# Patient Record
Sex: Male | Born: 1970 | Race: Black or African American | Hispanic: No | Marital: Married | Smoking: Never smoker
Health system: Southern US, Community
[De-identification: ages and names within clinical notes are randomized; demographics above are authoritative.]

## PROBLEM LIST (undated history)

## (undated) ENCOUNTER — Ambulatory Visit (HOSPITAL_COMMUNITY): Discharge status: Absent without leave | Payer: BC Managed Care – PPO

## (undated) DIAGNOSIS — I10 Essential (primary) hypertension: Secondary | ICD-10-CM

## (undated) DIAGNOSIS — E669 Obesity, unspecified: Secondary | ICD-10-CM

## (undated) DIAGNOSIS — E119 Type 2 diabetes mellitus without complications: Secondary | ICD-10-CM

## (undated) DIAGNOSIS — M109 Gout, unspecified: Secondary | ICD-10-CM

---

## 2013-07-23 DIAGNOSIS — I10 Essential (primary) hypertension: Secondary | ICD-10-CM | POA: Diagnosis present

## 2013-07-23 DIAGNOSIS — M109 Gout, unspecified: Secondary | ICD-10-CM | POA: Insufficient documentation

## 2019-03-29 ENCOUNTER — Ambulatory Visit: Payer: Self-pay | Attending: Internal Medicine

## 2019-03-29 DIAGNOSIS — Z23 Encounter for immunization: Secondary | ICD-10-CM

## 2019-03-29 NOTE — Progress Notes (Signed)
   Covid-19 Vaccination Clinic  Name:  [name] [name]    MRN: [id number] DOB: January 22, 1970  03/29/2019  Mr. [name] [name] was observed post Covid-19 immunization for 15 minutes without incident. He was provided with Vaccine Information Sheet and instruction to access the V-Safe system.   Mr. [name] [name] was instructed to call 911 with any severe reactions post vaccine: Marland Kitchen Difficulty breathing  . Swelling of face and throat  . A fast heartbeat  . A bad rash all over body  . Dizziness and weakness   Immunizations Administered    Name Date Dose VIS Date Route   Pfizer COVID-19 Vaccine 03/29/2019  8:49 AM 0.3 mL 12/15/2018 Intramuscular   Manufacturer: [employer], [employer]   Lot: [id number]   NDC: [address]-1

## 2019-04-24 ENCOUNTER — Ambulatory Visit: Payer: Self-pay | Attending: Internal Medicine

## 2019-04-24 DIAGNOSIS — Z23 Encounter for immunization: Secondary | ICD-10-CM

## 2019-04-24 NOTE — Progress Notes (Signed)
   Covid-19 Vaccination Clinic  Name:  Garhett Bernhard    MRN: [id number] DOB: 03-16-1970  04/24/2019  Mr. Wholey was observed post Covid-19 immunization for 15 minutes without incident. He was provided with Vaccine Information Sheet and instruction to access the V-Safe system.   Mr. Petzold was instructed to call 911 with any severe reactions post vaccine: Marland Kitchen Difficulty breathing  . Swelling of face and throat  . A fast heartbeat  . A bad rash all over body  . Dizziness and weakness   Immunizations Administered    Name Date Dose VIS Date Route   Pfizer COVID-19 Vaccine 04/24/2019  8:41 AM 0.3 mL 02/28/2018 Intramuscular   Manufacturer: [employer], [employer]   Lot: [id number]   NDC: [address]-6

## 2019-08-08 ENCOUNTER — Ambulatory Visit: Payer: Self-pay | Admitting: Dietician

## 2019-08-30 ENCOUNTER — Encounter: Payer: Self-pay | Admitting: Dietician

## 2019-08-30 NOTE — Progress Notes (Signed)
Have not heard back from patient to reschedule his missed appointment on 08/08/19. Sent notification to referring provider.

## 2020-08-22 ENCOUNTER — Emergency Department: Payer: BC Managed Care – PPO

## 2020-08-22 ENCOUNTER — Other Ambulatory Visit: Payer: Self-pay

## 2020-08-22 DIAGNOSIS — I10 Essential (primary) hypertension: Secondary | ICD-10-CM | POA: Diagnosis present

## 2020-08-22 DIAGNOSIS — Z20822 Contact with and (suspected) exposure to covid-19: Secondary | ICD-10-CM | POA: Diagnosis present

## 2020-08-22 DIAGNOSIS — R600 Localized edema: Secondary | ICD-10-CM | POA: Diagnosis present

## 2020-08-22 DIAGNOSIS — L97519 Non-pressure chronic ulcer of other part of right foot with unspecified severity: Secondary | ICD-10-CM | POA: Diagnosis present

## 2020-08-22 DIAGNOSIS — Z6841 Body Mass Index (BMI) 40.0 and over, adult: Secondary | ICD-10-CM

## 2020-08-22 DIAGNOSIS — M109 Gout, unspecified: Secondary | ICD-10-CM | POA: Diagnosis present

## 2020-08-22 DIAGNOSIS — L03115 Cellulitis of right lower limb: Principal | ICD-10-CM | POA: Diagnosis present

## 2020-08-22 DIAGNOSIS — E11621 Type 2 diabetes mellitus with foot ulcer: Secondary | ICD-10-CM | POA: Diagnosis present

## 2020-08-22 LAB — CBC WITH DIFFERENTIAL/PLATELET
Abs Immature Granulocytes: 0.16 10*3/uL — ABNORMAL HIGH (ref 0.00–0.07)
Basophils Absolute: 0.1 10*3/uL (ref 0.0–0.1)
Basophils Relative: 0 %
Eosinophils Absolute: 0.2 10*3/uL (ref 0.0–0.5)
Eosinophils Relative: 1 %
HCT: 37.4 % — ABNORMAL LOW (ref 39.0–52.0)
Hemoglobin: 12.1 g/dL — ABNORMAL LOW (ref 13.0–17.0)
Immature Granulocytes: 1 %
Lymphocytes Relative: 15 %
Lymphs Abs: 2.1 10*3/uL (ref 0.7–4.0)
MCH: 29.5 pg (ref 26.0–34.0)
MCHC: 32.4 g/dL (ref 30.0–36.0)
MCV: 91.2 fL (ref 80.0–100.0)
Monocytes Absolute: 1.1 10*3/uL — ABNORMAL HIGH (ref 0.1–1.0)
Monocytes Relative: 8 %
Neutro Abs: 10.5 10*3/uL — ABNORMAL HIGH (ref 1.7–7.7)
Neutrophils Relative %: 75 %
Platelets: 434 10*3/uL — ABNORMAL HIGH (ref 150–400)
RBC: 4.1 MIL/uL — ABNORMAL LOW (ref 4.22–5.81)
RDW: 14.4 % (ref 11.5–15.5)
WBC: 14.2 10*3/uL — ABNORMAL HIGH (ref 4.0–10.5)
nRBC: 0 % (ref 0.0–0.2)

## 2020-08-22 LAB — COMPREHENSIVE METABOLIC PANEL
ALT: 21 U/L (ref 0–44)
AST: 22 U/L (ref 15–41)
Albumin: 3.2 g/dL — ABNORMAL LOW (ref 3.5–5.0)
Alkaline Phosphatase: 94 U/L (ref 38–126)
Anion gap: 13 (ref 5–15)
BUN: 15 mg/dL (ref 6–20)
CO2: 24 mmol/L (ref 22–32)
Calcium: 9.1 mg/dL (ref 8.9–10.3)
Chloride: 97 mmol/L — ABNORMAL LOW (ref 98–111)
Creatinine, Ser: 1.13 mg/dL (ref 0.61–1.24)
GFR, Estimated: >60 mL/min (ref >60)
Glucose, Bld: 148 mg/dL — ABNORMAL HIGH (ref 70–99)
Potassium: 3.7 mmol/L (ref 3.5–5.1)
Sodium: 134 mmol/L — ABNORMAL LOW (ref 135–145)
Total Bilirubin: 0.5 mg/dL (ref 0.3–1.2)
Total Protein: 8.8 g/dL — ABNORMAL HIGH (ref 6.5–8.1)

## 2020-08-22 LAB — LACTIC ACID, PLASMA: Lactic Acid, Venous: 1.6 mmol/L (ref 0.5–1.9)

## 2020-08-22 LAB — PROTIME-INR
INR: 1.1 (ref 0.8–1.2)
Prothrombin Time: 14.4 seconds (ref 11.4–15.2)

## 2020-08-22 NOTE — ED Triage Notes (Signed)
Patient reports abscess to right foot that was "lanced" at an urgent care today. Patient reports dx of cellulitis to RLE on Tuesday, and reports taking Cephlex since Tuesday. Patient is noted to have swelling and redness to right foot and RLE to knee. Patient denies fever at home.

## 2020-08-22 NOTE — ED Notes (Signed)
Patient transported to X-ray 

## 2020-08-23 ENCOUNTER — Inpatient Hospital Stay: Payer: BC Managed Care – PPO

## 2020-08-23 ENCOUNTER — Emergency Department: Payer: BC Managed Care – PPO

## 2020-08-23 ENCOUNTER — Encounter: Payer: Self-pay | Admitting: Internal Medicine

## 2020-08-23 ENCOUNTER — Inpatient Hospital Stay
Admission: EM | Admit: 2020-08-23 | Discharge: 2020-08-26 | DRG: 603 | Disposition: A | Discharge status: Home / Self Care | Payer: BC Managed Care – PPO | Attending: Internal Medicine | Admitting: Internal Medicine

## 2020-08-23 DIAGNOSIS — I10 Essential (primary) hypertension: Secondary | ICD-10-CM | POA: Diagnosis present

## 2020-08-23 DIAGNOSIS — Z20822 Contact with and (suspected) exposure to covid-19: Secondary | ICD-10-CM | POA: Diagnosis present

## 2020-08-23 DIAGNOSIS — E119 Type 2 diabetes mellitus without complications: Secondary | ICD-10-CM

## 2020-08-23 DIAGNOSIS — Z6841 Body Mass Index (BMI) 40.0 and over, adult: Secondary | ICD-10-CM | POA: Diagnosis not present

## 2020-08-23 DIAGNOSIS — I159 Secondary hypertension, unspecified: Secondary | ICD-10-CM | POA: Diagnosis not present

## 2020-08-23 DIAGNOSIS — R0609 Other forms of dyspnea: Secondary | ICD-10-CM | POA: Diagnosis not present

## 2020-08-23 DIAGNOSIS — R600 Localized edema: Secondary | ICD-10-CM | POA: Diagnosis present

## 2020-08-23 DIAGNOSIS — L97519 Non-pressure chronic ulcer of other part of right foot with unspecified severity: Secondary | ICD-10-CM | POA: Diagnosis present

## 2020-08-23 DIAGNOSIS — M7989 Other specified soft tissue disorders: Secondary | ICD-10-CM | POA: Diagnosis present

## 2020-08-23 DIAGNOSIS — L03115 Cellulitis of right lower limb: Secondary | ICD-10-CM | POA: Diagnosis present

## 2020-08-23 DIAGNOSIS — R52 Pain, unspecified: Secondary | ICD-10-CM

## 2020-08-23 DIAGNOSIS — E11621 Type 2 diabetes mellitus with foot ulcer: Secondary | ICD-10-CM | POA: Diagnosis present

## 2020-08-23 DIAGNOSIS — T148XXA Other injury of unspecified body region, initial encounter: Secondary | ICD-10-CM

## 2020-08-23 DIAGNOSIS — E1169 Type 2 diabetes mellitus with other specified complication: Secondary | ICD-10-CM | POA: Diagnosis not present

## 2020-08-23 DIAGNOSIS — M109 Gout, unspecified: Secondary | ICD-10-CM | POA: Diagnosis present

## 2020-08-23 HISTORY — DX: Essential (primary) hypertension: I10

## 2020-08-23 HISTORY — DX: Obesity, unspecified: E66.9

## 2020-08-23 HISTORY — DX: Gout, unspecified: M10.9

## 2020-08-23 HISTORY — DX: Type 2 diabetes mellitus without complications: E11.9

## 2020-08-23 LAB — CBG MONITORING, ED
Glucose-Capillary: 144 mg/dL — ABNORMAL HIGH (ref 70–99)
Glucose-Capillary: 155 mg/dL — ABNORMAL HIGH (ref 70–99)
Glucose-Capillary: 126 mg/dL — ABNORMAL HIGH (ref 70–99)

## 2020-08-23 LAB — CBC
HCT: 33.2 % — ABNORMAL LOW (ref 39.0–52.0)
Hemoglobin: 10.6 g/dL — ABNORMAL LOW (ref 13.0–17.0)
MCH: 28.6 pg (ref 26.0–34.0)
MCHC: 31.9 g/dL (ref 30.0–36.0)
MCV: 89.7 fL (ref 80.0–100.0)
Platelets: 393 10*3/uL (ref 150–400)
RBC: 3.7 MIL/uL — ABNORMAL LOW (ref 4.22–5.81)
RDW: 14.3 % (ref 11.5–15.5)
WBC: 14.6 10*3/uL — ABNORMAL HIGH (ref 4.0–10.5)
nRBC: 0 % (ref 0.0–0.2)

## 2020-08-23 LAB — GLUCOSE, CAPILLARY: Glucose-Capillary: 151 mg/dL — ABNORMAL HIGH (ref 70–99)

## 2020-08-23 LAB — BRAIN NATRIURETIC PEPTIDE
B Natriuretic Peptide: 41.7 pg/mL (ref 0.0–100.0)
B Natriuretic Peptide: 20.1 pg/mL (ref 0.0–100.0)

## 2020-08-23 LAB — SARS CORONAVIRUS 2 (TAT 6-24 HRS): SARS Coronavirus 2: NEGATIVE

## 2020-08-23 LAB — CREATININE, SERUM
Creatinine, Ser: 1.05 mg/dL (ref 0.61–1.24)
GFR, Estimated: >60 mL/min (ref >60)

## 2020-08-23 LAB — URINALYSIS, COMPLETE (UACMP) WITH MICROSCOPIC
Bacteria, UA: NONE SEEN
Glucose, UA: NEGATIVE mg/dL
Hgb urine dipstick: NEGATIVE
Ketones, ur: NEGATIVE mg/dL
Leukocytes,Ua: NEGATIVE
Nitrite: NEGATIVE
Protein, ur: 100 mg/dL — AB
Specific Gravity, Urine: 1.025 (ref 1.005–1.030)
pH: 6 (ref 5.0–8.0)

## 2020-08-23 LAB — MRSA NEXT GEN BY PCR, NASAL: MRSA by PCR Next Gen: NOT DETECTED

## 2020-08-23 LAB — TROPONIN I (HIGH SENSITIVITY): Troponin I (High Sensitivity): 11 ng/L (ref <18)

## 2020-08-23 LAB — HEMOGLOBIN A1C
Hgb A1c MFr Bld: 7.4 % — ABNORMAL HIGH (ref 4.8–5.6)
Mean Plasma Glucose: 165.68 mg/dL

## 2020-08-23 LAB — PROCALCITONIN: Procalcitonin: 0.27 ng/mL

## 2020-08-23 LAB — LACTIC ACID, PLASMA: Lactic Acid, Venous: 1.5 mmol/L (ref 0.5–1.9)

## 2020-08-23 MED ORDER — INSULIN ASPART 100 UNIT/ML IJ SOLN: 0.0000 [IU] | Freq: Every day | INTRAMUSCULAR | Status: DC

## 2020-08-23 MED ORDER — HYDROCODONE-ACETAMINOPHEN 5-325 MG PO TABS
1.0000 | ORAL_TABLET | ORAL | Status: DC | PRN
  Administered 2020-08-24: 1 via ORAL
  Administered 2020-08-24: 2 via ORAL
  Filled 2020-08-23: qty 1
  Filled 2020-08-23: qty 2

## 2020-08-23 MED ORDER — MORPHINE SULFATE (PF) 4 MG/ML IV SOLN
4.0000 mg | Freq: Once | INTRAVENOUS | Status: AC
  Administered 2020-08-23: 4 mg via INTRAVENOUS
  Filled 2020-08-23: qty 1

## 2020-08-23 MED ORDER — KETOROLAC TROMETHAMINE 30 MG/ML IJ SOLN: 30.0000 mg | Freq: Four times a day (QID) | INTRAMUSCULAR | Status: DC | PRN

## 2020-08-23 MED ORDER — ACETAMINOPHEN 650 MG RE SUPP: 650.0000 mg | Freq: Four times a day (QID) | RECTAL | Status: DC | PRN

## 2020-08-23 MED ORDER — IOHEXOL 350 MG/ML SOLN
125.0000 mL | Freq: Once | INTRAVENOUS | Status: AC | PRN
  Administered 2020-08-23: 125 mL via INTRAVENOUS
  Filled 2020-08-23: qty 125

## 2020-08-23 MED ORDER — SENNOSIDES-DOCUSATE SODIUM 8.6-50 MG PO TABS: 1.0000 | ORAL_TABLET | Freq: Every evening | ORAL | Status: DC | PRN

## 2020-08-23 MED ORDER — VANCOMYCIN HCL 2000 MG/400ML IV SOLN
2000.0000 mg | Freq: Two times a day (BID) | INTRAVENOUS | Status: DC
  Administered 2020-08-23 – 2020-08-25 (×3): 2000 mg via INTRAVENOUS
  Filled 2020-08-23 (×6): qty 400

## 2020-08-23 MED ORDER — ONDANSETRON HCL 4 MG PO TABS: 4.0000 mg | ORAL_TABLET | Freq: Four times a day (QID) | ORAL | Status: DC | PRN

## 2020-08-23 MED ORDER — ACETAMINOPHEN 325 MG PO TABS
650.0000 mg | ORAL_TABLET | Freq: Four times a day (QID) | ORAL | Status: DC | PRN
  Administered 2020-08-26: 650 mg via ORAL
  Filled 2020-08-23: qty 2

## 2020-08-23 MED ORDER — SODIUM CHLORIDE 0.9 % IV SOLN
2.0000 g | Freq: Three times a day (TID) | INTRAVENOUS | Status: DC
  Administered 2020-08-23 – 2020-08-26 (×9): 2 g via INTRAVENOUS
  Filled 2020-08-23 (×12): qty 2

## 2020-08-23 MED ORDER — VANCOMYCIN HCL IN DEXTROSE 1-5 GM/200ML-% IV SOLN
1000.0000 mg | Freq: Once | INTRAVENOUS | Status: AC
  Administered 2020-08-23: 1000 mg via INTRAVENOUS
  Filled 2020-08-23: qty 200

## 2020-08-23 MED ORDER — CARVEDILOL 25 MG PO TABS
25.0000 mg | ORAL_TABLET | Freq: Two times a day (BID) | ORAL | Status: DC
  Administered 2020-08-23 – 2020-08-26 (×6): 25 mg via ORAL
  Filled 2020-08-23 (×2): qty 1
  Filled 2020-08-23: qty 4
  Filled 2020-08-23 (×3): qty 1

## 2020-08-23 MED ORDER — ONDANSETRON HCL 4 MG/2ML IJ SOLN
4.0000 mg | Freq: Once | INTRAMUSCULAR | Status: AC
  Administered 2020-08-23: 4 mg via INTRAVENOUS
  Filled 2020-08-23: qty 2

## 2020-08-23 MED ORDER — METOPROLOL TARTRATE 50 MG PO TABS: 50.0000 mg | ORAL_TABLET | Freq: Two times a day (BID) | ORAL | Status: DC

## 2020-08-23 MED ORDER — SODIUM CHLORIDE 0.9 % IV SOLN
2.0000 g | Freq: Once | INTRAVENOUS | Status: AC
  Administered 2020-08-23: 2 g via INTRAVENOUS
  Filled 2020-08-23: qty 2

## 2020-08-23 MED ORDER — INSULIN ASPART 100 UNIT/ML IJ SOLN
0.0000 [IU] | Freq: Three times a day (TID) | INTRAMUSCULAR | Status: DC
  Administered 2020-08-23: 3 [IU] via SUBCUTANEOUS
  Administered 2020-08-23: 4 [IU] via SUBCUTANEOUS
  Administered 2020-08-23: 3 [IU] via SUBCUTANEOUS
  Administered 2020-08-24: 4 [IU] via SUBCUTANEOUS
  Administered 2020-08-24: 3 [IU] via SUBCUTANEOUS
  Administered 2020-08-24: 4 [IU] via SUBCUTANEOUS
  Administered 2020-08-25: 3 [IU] via SUBCUTANEOUS
  Administered 2020-08-25 (×2): 4 [IU] via SUBCUTANEOUS
  Administered 2020-08-26: 3 [IU] via SUBCUTANEOUS
  Filled 2020-08-23 (×10): qty 1

## 2020-08-23 MED ORDER — VANCOMYCIN HCL 1500 MG/300ML IV SOLN
1500.0000 mg | Freq: Once | INTRAVENOUS | Status: AC
  Administered 2020-08-23: 1500 mg via INTRAVENOUS
  Filled 2020-08-23: qty 300

## 2020-08-23 MED ORDER — ENOXAPARIN SODIUM 120 MG/0.8ML IJ SOSY
0.5000 mg/kg | PREFILLED_SYRINGE | INTRAMUSCULAR | Status: DC
  Administered 2020-08-23 – 2020-08-25 (×3): 120 mg via SUBCUTANEOUS
  Filled 2020-08-23 (×5): qty 0.8

## 2020-08-23 MED ORDER — ONDANSETRON HCL 4 MG/2ML IJ SOLN: 4.0000 mg | Freq: Four times a day (QID) | INTRAMUSCULAR | Status: DC | PRN

## 2020-08-23 NOTE — H&P (Signed)
History and Physical    [name] [name] LNL:[id number] DOB: [birth year] DOA: 08/23/2020  PCP: Lauro Regulus, MD   Patient coming from: home  I have personally briefly reviewed patient's old medical records in [facility]  Chief Complaint: right foot swelling  HPI: [name] [name] is a 50 y.o. male with medical history significant for DM and HTN who presents to the ED from the urgent care for management of right lower leg cellulitis not responding to outpatient Keflex.  Patient was in his usual state of health until he noticed a small red area on his right foot a week ago which progressively started getting larger.  He went to the urgent care 2 days later and got a prescription for Keflex however the area started extending going to the ankle and then up his lower leg and in the past couple days he developed a large blister on the medial aspect of his right foot for which she returned to the urgent care at which point they sent him to the ED for evaluation.  He denies fever but did have chills when he first went to the urgent care.  He denies cough, shortness of breath or chest pain.  Denies nausea, vomiting or abdominal pain or diarrhea.  ED course: On arrival temperature 99, BP 170/98 with pulse 96 and O2 sat 96% on room air Blood work with WBC 14,000 lactic acid 1.5, procalcitonin 0.27.  Troponin 11 and BNP 41.  Labs otherwise unremarkable  Imaging: Chest x-ray mild central vascular congestion without focal consolidation Venous Doppler right leg: Limited exam Right foot x-ray: No radiographic evidence of acute osteomyelitis, diffuse soft tissue edema  Patient started on vancomycin.  Hospitalist consulted for admission. Review of Systems: As per HPI otherwise all other systems on review of systems negative.    Past Medical History:  Diagnosis Date   DM (diabetes mellitus) (HCC)    Gout    HTN (hypertension)    Obesity     History reviewed. No pertinent surgical  history.   reports that he has never smoked. He has never used smokeless tobacco. He reports that he does not drink alcohol and does not use drugs.  No Known Allergies  History reviewed. No pertinent family history.    Prior to Admission medications   Not on File    Physical Exam: Vitals:   08/22/20 2305 08/23/20 0200 08/23/20 0300 08/23/20 0400  BP:  (!) 153/79 131/71 (!) 152/77  Pulse:  93 84 86  Resp:  19 19   Temp:      TempSrc:      SpO2:  94% 97% 94%  Weight: (!) 240.4 kg     Height: 6\' 2"  (1.88 m)        Vitals:   08/22/20 2305 08/23/20 0200 08/23/20 0300 08/23/20 0400  BP:  (!) 153/79 131/71 (!) 152/77  Pulse:  93 84 86  Resp:  19 19   Temp:      TempSrc:      SpO2:  94% 97% 94%  Weight: (!) 240.4 kg     Height: 6\' 2"  (1.88 m)         Constitutional: Alert and oriented x 3 . Not in any apparent distress HEENT:      Head: Normocephalic and atraumatic.         Eyes: PERLA, EOMI, Conjunctivae are normal. Sclera is non-icteric.       Mouth/Throat: Mucous membranes are moist.  Neck: Supple with no signs of meningismus. Cardiovascular: Regular rate and rhythm. No murmurs, gallops, or rubs. 2+ symmetrical distal pulses are present . No JVD. No LE edema Respiratory: Respiratory effort normal .Lungs sounds clear bilaterally. No wheezes, crackles, or rhonchi.  Gastrointestinal: Soft, non tender, and non distended with positive bowel sounds.  Genitourinary: No CVA tenderness. Musculoskeletal: Nontender with normal range of motion in all extremities.    Neurologic:  Face is symmetric. Moving all extremities. No gross focal neurologic deficits . Skin: Skin is warm, dry. See pic Psychiatric: Mood and affect are normal    Labs on Admission: I have personally reviewed following labs and imaging studies  CBC: Recent Labs  Lab 08/22/20 2310  WBC 14.2*  NEUTROABS 10.5*  HGB 12.1*  HCT 37.4*  MCV 91.2  PLT 434*   Basic Metabolic Panel: Recent Labs   Lab 08/22/20 2310  NA 134*  K 3.7  CL 97*  CO2 24  GLUCOSE 148*  BUN 15  CREATININE 1.13  CALCIUM 9.1   GFR: Estimated Creatinine Clearance: 159.2 mL/min (by C-G formula based on SCr of 1.13 mg/dL). Liver Function Tests: Recent Labs  Lab 08/22/20 2310  AST 22  ALT 21  ALKPHOS 94  BILITOT 0.5  PROT 8.8*  ALBUMIN 3.2*   No results for input(s): LIPASE, AMYLASE in the last 168 hours. No results for input(s): AMMONIA in the last 168 hours. Coagulation Profile: Recent Labs  Lab 08/22/20 2310  INR 1.1   Cardiac Enzymes: No results for input(s): CKTOTAL, CKMB, CKMBINDEX, TROPONINI in the last 168 hours. BNP (last 3 results) No results for input(s): PROBNP in the last 8760 hours. HbA1C: No results for input(s): HGBA1C in the last 72 hours. CBG: No results for input(s): GLUCAP in the last 168 hours. Lipid Profile: No results for input(s): CHOL, HDL, LDLCALC, TRIG, CHOLHDL, LDLDIRECT in the last 72 hours. Thyroid Function Tests: No results for input(s): TSH, T4TOTAL, FREET4, T3FREE, THYROIDAB in the last 72 hours. Anemia Panel: No results for input(s): VITAMINB12, FOLATE, FERRITIN, TIBC, IRON, RETICCTPCT in the last 72 hours. Urine analysis: No results found for: COLORURINE, APPEARANCEUR, LABSPEC, PHURINE, GLUCOSEU, HGBUR, BILIRUBINUR, KETONESUR, PROTEINUR, UROBILINOGEN, NITRITE, LEUKOCYTESUR  Radiological Exams on Admission: DG Chest 2 View  Result Date: 08/22/2020 CLINICAL DATA:  Suspected sepsis. EXAM: CHEST - 2 VIEW COMPARISON:  None. FINDINGS: Mild cardiomegaly with mild central vascular congestion. No focal consolidation, pleural effusion, pneumothorax. No acute osseous pathology. IMPRESSION: Mild cardiomegaly with mild central vascular congestion. No focal consolidation. Electronically Signed   By: Elgie Collard M.D.   On: 08/22/2020 23:31   US Venous Img Lower Unilateral Right (DVT)  Result Date: 08/23/2020 CLINICAL DATA:  Right foot pain and erythema EXAM:  RIGHT LOWER EXTREMITY VENOUS DOPPLER ULTRASOUND TECHNIQUE: Gray-scale sonography with compression, as well as color and duplex ultrasound, were performed to evaluate the deep venous system(s) from the level of the common femoral vein through the popliteal and proximal calf veins. COMPARISON:  None. FINDINGS: VENOUS Imaging is limited by the patient's body habitus. There is poor grayscale imaging of the venous structures of the right lower extremity. There is preserved antegrade flow and normal augmentation involving the right common femoral, femoral, central profundus femoral, and popliteal veins. The infrapopliteal venous vasculature is not well visualized. Limited views of the contralateral common femoral vein are unremarkable. OTHER None. Limitations: Imaging is significantly limited by the patient's body habitus and depth of the none venous structures which are only appreciated with color Doppler  imaging. IMPRESSION: Markedly limited examination with preserved patency of the right femoropopliteal venous vasculature. If there is clinical suspicion of nonocclusive thrombus within the right lower extremity, CT or MR venography may be more helpful for further evaluation. Electronically Signed   By: Helyn Numbers M.D.   On: 08/23/2020 03:11   DG Foot Complete Right  Result Date: 08/23/2020 CLINICAL DATA:  Evaluate for osteomyelitis. Abscess to the right foot. EXAM: RIGHT FOOT COMPLETE - 3+ VIEW COMPARISON:  None. FINDINGS: There is no acute fracture or dislocation. No bone erosion or periosteal elevation. Mild degenerative changes of the midfoot. There is diffuse soft tissue edema. Small pockets air in the superficial soft tissues of the medial foot may be related to recent abscess drainage. A 5 mm radiopaque focus over the plantar aspect of the midfoot may represent a wound in the skin. Clinical correlation is recommended to exclude retained foreign object. IMPRESSION: 1. No acute fracture or dislocation. No  radiographic evidence of acute osteomyelitis. 2. Diffuse soft tissue edema. Small pockets of air in the medial foot, likely related to recent abscess drainage. Electronically Signed   By: Elgie Collard M.D.   On: 08/23/2020 02:41     Assessment/Plan 50 year old male with history of presenting with right leg cellulitis not responding to outpatient oral antibiotics.      Cellulitis of right lower leg with blister right foot - Vancomycin and cefepime - Keep leg elevated - Pain control - Podiatry consult - Wound care consult    Hypertension - Amlodipine 5 mg daily    Diabetes mellitus, type II (HCC) - Sliding scale insulin    Morbid obesity with BMI of 60.0-69.9, adult (HCC) - Complicating factor to overall prognosis and care    DVT prophylaxis: Lovenox  Code Status: full code  Family Communication:  wife at bedside Disposition Plan: Back to previous home environment Consults called: Podiatry Status:At the time of admission, it appears that the appropriate admission status for this patient is INPATIENT. This is judged to be reasonable and necessary in order to provide the required intensity of service to ensure the patient's safety given the presenting symptoms, physical exam findings, and initial radiographic and laboratory data in the context of their  Comorbid conditions.   Patient requires inpatient status due to high intensity of service, high risk for further deterioration and high frequency of surveillance required.   I certify that at the point of admission it is my clinical judgment that the patient will require inpatient hospital care spanning beyond 2 midnights     Andris Baumann MD [facility]     08/23/2020, 5:14 AM

## 2020-08-23 NOTE — Consult Note (Signed)
Reason for Consult:Right foot cellulitis, blister Referring Physician: Dr. Lindajo Royal, MD  [name] [name] is an 50 y.o. male.  HPI: 50 year old male with past medical history significant for diabetes, hypertension, morbid obesity presented to the emergency room from urgent care given worsening cellulitis, blister of his right foot.  Previously been on Keflex as an outpatient.  The patient and his wife stated that leg could become swollen and red prior to the blister forming.  He is diabetic but states he has not checked his blood sugar at home.  Currently denies any fevers or chills.  He is resting in bed comfortably with his wife at bedside.  Past Medical History:  Diagnosis Date   DM (diabetes mellitus) (HCC)    Gout    HTN (hypertension)    Obesity     History reviewed. No pertinent surgical history.  History reviewed. No pertinent family history.  Social History:  reports that he has never smoked. He has never used smokeless tobacco. He reports that he does not drink alcohol and does not use drugs.  Allergies: No Known Allergies  Medications: I have reviewed the patient's current medications.  Results for orders placed or performed during the hospital encounter of 08/23/20 (from the past 48 hour(s))  Comprehensive metabolic panel     Status: Abnormal   Collection Time: 08/22/20 11:10 PM  Result Value Ref Range   Sodium 134 (L) 135 - 145 mmol/L   Potassium 3.7 3.5 - 5.1 mmol/L   Chloride 97 (L) 98 - 111 mmol/L   CO2 24 22 - 32 mmol/L   Glucose, Bld 148 (H) 70 - 99 mg/dL    Comment: Glucose reference range applies only to samples taken after fasting for at least 8 hours.   BUN 15 6 - 20 mg/dL   Creatinine, Ser 1.61 0.61 - 1.24 mg/dL   Calcium 9.1 8.9 - 09.6 mg/dL   Total Protein 8.8 (H) 6.5 - 8.1 g/dL   Albumin 3.2 (L) 3.5 - 5.0 g/dL   AST 22 15 - 41 U/L   ALT 21 0 - 44 U/L   Alkaline Phosphatase 94 38 - 126 U/L   Total Bilirubin 0.5 0.3 - 1.2 mg/dL   GFR, Estimated  >04 >54 mL/min    Comment: (NOTE) Calculated using the CKD-EPI Creatinine Equation (2021)    Anion gap 13 5 - 15    Comment: Performed at [facility], [address] [place], [place] [address]  Lactic acid, plasma     Status: None   Collection Time: 08/22/20 11:10 PM  Result Value Ref Range   Lactic Acid, Venous 1.6 0.5 - 1.9 mmol/L    Comment: Performed at [facility], [address] [place], [place] [address]  CBC with Differential     Status: Abnormal   Collection Time: 08/22/20 11:10 PM  Result Value Ref Range   WBC 14.2 (H) 4.0 - 10.5 K/uL   RBC 4.10 (L) 4.22 - 5.81 MIL/uL   Hemoglobin 12.1 (L) 13.0 - 17.0 g/dL   HCT 29.5 (L) 62.1 - 30.8 %   MCV 91.2 80.0 - 100.0 fL   MCH 29.5 26.0 - 34.0 pg   MCHC 32.4 30.0 - 36.0 g/dL   RDW 65.7 84.6 - 96.2 %   Platelets 434 (H) 150 - 400 K/uL   nRBC 0.0 0.0 - 0.2 %   Neutrophils Relative % 75 %   Neutro Abs 10.5 (H) 1.7 - 7.7 K/uL   Lymphocytes Relative 15 %  Lymphs Abs 2.1 0.7 - 4.0 K/uL   Monocytes Relative 8 %   Monocytes Absolute 1.1 (H) 0.1 - 1.0 K/uL   Eosinophils Relative 1 %   Eosinophils Absolute 0.2 0.0 - 0.5 K/uL   Basophils Relative 0 %   Basophils Absolute 0.1 0.0 - 0.1 K/uL   Immature Granulocytes 1 %   Abs Immature Granulocytes 0.16 (H) 0.00 - 0.07 K/uL    Comment: Performed at [facility], [address] [place], [place] [address]  Protime-INR     Status: None   Collection Time: 08/22/20 11:10 PM  Result Value Ref Range   Prothrombin Time 14.4 11.4 - 15.2 seconds   INR 1.1 0.8 - 1.2    Comment: (NOTE) INR goal varies based on device and disease states. Performed at [facility], [address] [place], [place] [address]   Culture, blood (Routine x 2)     Status: None (Preliminary result)   Collection Time: 08/22/20 11:10 PM   Specimen: BLOOD  Result Value Ref Range   Specimen Description BLOOD BLOOD LEFT HAND    Special Requests      BOTTLES DRAWN AEROBIC AND  ANAEROBIC Blood Culture results may not be optimal due to an excessive volume of blood received in culture bottles   Culture      NO GROWTH < 12 HOURS Performed at [facility], [address] [place], [place] [address]    Report Status PENDING   Troponin I (High Sensitivity)     Status: None   Collection Time: 08/22/20 11:10 PM  Result Value Ref Range   Troponin I (High Sensitivity) 11 <18 ng/L    Comment: (NOTE) Elevated high sensitivity troponin I (hsTnI) values and significant  changes across serial measurements may suggest ACS but many other  chronic and acute conditions are known to elevate hsTnI results.  Refer to the "Links" section for chest pain algorithms and additional  guidance. Performed at [facility], [address] [place], [place] [address]   Brain natriuretic peptide     Status: None   Collection Time: 08/22/20 11:10 PM  Result Value Ref Range   B Natriuretic Peptide 41.7 0.0 - 100.0 pg/mL    Comment: Performed at [facility], [address] [place], [place] [address]  Procalcitonin - Baseline     Status: None   Collection Time: 08/22/20 11:10 PM  Result Value Ref Range   Procalcitonin 0.27 ng/mL    Comment:        Interpretation: PCT (Procalcitonin) <= 0.5 ng/mL: Systemic infection (sepsis) is not likely. Local bacterial infection is possible. (NOTE)       Sepsis PCT Algorithm           Lower Respiratory Tract                                      Infection PCT Algorithm    ----------------------------     ----------------------------         PCT < 0.25 ng/mL                PCT < 0.10 ng/mL          Strongly encourage             Strongly discourage   discontinuation of antibiotics    initiation of antibiotics    ----------------------------     -----------------------------  PCT 0.25 - 0.50 ng/mL            PCT 0.10 - 0.25 ng/mL               OR       >80% decrease in PCT            Discourage initiation of                                             antibiotics      Encourage discontinuation           of antibiotics    ----------------------------     -----------------------------         PCT >= 0.50 ng/mL              PCT 0.26 - 0.50 ng/mL               AND        <80% decrease in PCT             Encourage initiation of                                             antibiotics       Encourage continuation           of antibiotics    ----------------------------     -----------------------------        PCT >= 0.50 ng/mL                  PCT > 0.50 ng/mL               AND         increase in PCT                  Strongly encourage                                      initiation of antibiotics    Strongly encourage escalation           of antibiotics                                     -----------------------------                                           PCT <= 0.25 ng/mL                                                 OR                                        > 80% decrease in PCT  Discontinue / Do not initiate                                             antibiotics  Performed at [facility], [address] [place], [place] [address]   Lactic acid, plasma     Status: None   Collection Time: 08/23/20  2:36 AM  Result Value Ref Range   Lactic Acid, Venous 1.5 0.5 - 1.9 mmol/L    Comment: Performed at [facility], [address] [place], [place] [address]  Aerobic/Anaerobic Culture w Gram Stain (surgical/deep wound)     Status: None (Preliminary result)   Collection Time: 08/23/20  2:36 AM   Specimen: Foot  Result Value Ref Range   Specimen Description      FOOT Performed at [facility], [address] [place], [place] [address]    Special Requests      NONE Performed at [facility], [address], [place], [place] [address]    Gram Stain      RARE WBC PRESENT,BOTH PMN AND MONONUCLEAR RARE GRAM  POSITIVE COCCI IN PAIRS Performed at [facility], [address] [place], [place] [address]    Culture PENDING    Report Status PENDING   Culture, blood (Routine X 2) w Reflex to ID Panel     Status: None (Preliminary result)   Collection Time: 08/23/20  2:36 AM   Specimen: BLOOD  Result Value Ref Range   Specimen Description BLOOD RIGHT ASSIST CONTROL    Special Requests      BOTTLES DRAWN AEROBIC AND ANAEROBIC Blood Culture adequate volume   Culture      NO GROWTH < 12 HOURS Performed at [facility], [address] [place], [place] [address]    Report Status PENDING   CBC     Status: Abnormal   Collection Time: 08/23/20  6:55 AM  Result Value Ref Range   WBC 14.6 (H) 4.0 - 10.5 K/uL   RBC 3.70 (L) 4.22 - 5.81 MIL/uL   Hemoglobin 10.6 (L) 13.0 - 17.0 g/dL   HCT 63.3 (L) 35.4 - 56.2 %   MCV 89.7 80.0 - 100.0 fL   MCH 28.6 26.0 - 34.0 pg   MCHC 31.9 30.0 - 36.0 g/dL   RDW 56.3 89.3 - 73.4 %   Platelets 393 150 - 400 K/uL   nRBC 0.0 0.0 - 0.2 %    Comment: Performed at [facility], [address] [place], [place] [address]  Creatinine, serum     Status: None   Collection Time: 08/23/20  6:55 AM  Result Value Ref Range   Creatinine, Ser 1.05 0.61 - 1.24 mg/dL   GFR, Estimated >11 >57 mL/min    Comment: (NOTE) Calculated using the CKD-EPI Creatinine Equation (2021) Performed at [facility], [address] [place], [place] [address]   CBG monitoring, ED     Status: Abnormal   Collection Time: 08/23/20  7:44 AM  Result Value Ref Range   Glucose-Capillary 144 (H) 70 - 99 mg/dL    Comment: Glucose reference range applies only to samples taken after fasting for at least 8 hours.  MRSA Next Gen by PCR, Nasal     Status: None   Collection Time: 08/23/20  8:24 AM   Specimen: Nasopharyngeal Swab; Nasal Swab  Result Value  Ref Range   MRSA by PCR Next Gen NOT DETECTED NOT DETECTED    Comment: (NOTE) The GeneXpert MRSA Assay (FDA  approved for NASAL specimens only), is one component of a comprehensive MRSA colonization surveillance program. It is not intended to diagnose MRSA infection nor to guide or monitor treatment for MRSA infections. Test performance is not FDA approved in patients less than 15 years old. Performed at [facility], [address] [place], [place] [address]   CBG monitoring, ED     Status: Abnormal   Collection Time: 08/23/20 11:23 AM  Result Value Ref Range   Glucose-Capillary 155 (H) 70 - 99 mg/dL    Comment: Glucose reference range applies only to samples taken after fasting for at least 8 hours.    DG Chest 2 View  Result Date: 08/22/2020 CLINICAL DATA:  Suspected sepsis. EXAM: CHEST - 2 VIEW COMPARISON:  None. FINDINGS: Mild cardiomegaly with mild central vascular congestion. No focal consolidation, pleural effusion, pneumothorax. No acute osseous pathology. IMPRESSION: Mild cardiomegaly with mild central vascular congestion. No focal consolidation. Electronically Signed   By: Elgie Collard M.D.   On: 08/22/2020 23:31   US Venous Img Lower Unilateral Right (DVT)  Result Date: 08/23/2020 CLINICAL DATA:  Right foot pain and erythema EXAM: RIGHT LOWER EXTREMITY VENOUS DOPPLER ULTRASOUND TECHNIQUE: Gray-scale sonography with compression, as well as color and duplex ultrasound, were performed to evaluate the deep venous system(s) from the level of the common femoral vein through the popliteal and proximal calf veins. COMPARISON:  None. FINDINGS: VENOUS Imaging is limited by the patient's body habitus. There is poor grayscale imaging of the venous structures of the right lower extremity. There is preserved antegrade flow and normal augmentation involving the right common femoral, femoral, central profundus femoral, and popliteal veins. The infrapopliteal venous vasculature is not well visualized. Limited views of the contralateral common femoral vein are unremarkable. OTHER None.  Limitations: Imaging is significantly limited by the patient's body habitus and depth of the none venous structures which are only appreciated with color Doppler imaging. IMPRESSION: Markedly limited examination with preserved patency of the right femoropopliteal venous vasculature. If there is clinical suspicion of nonocclusive thrombus within the right lower extremity, CT or MR venography may be more helpful for further evaluation. Electronically Signed   By: Helyn Numbers M.D.   On: 08/23/2020 03:11   DG Foot Complete Right  Result Date: 08/23/2020 CLINICAL DATA:  Evaluate for osteomyelitis. Abscess to the right foot. EXAM: RIGHT FOOT COMPLETE - 3+ VIEW COMPARISON:  None. FINDINGS: There is no acute fracture or dislocation. No bone erosion or periosteal elevation. Mild degenerative changes of the midfoot. There is diffuse soft tissue edema. Small pockets air in the superficial soft tissues of the medial foot may be related to recent abscess drainage. A 5 mm radiopaque focus over the plantar aspect of the midfoot may represent a wound in the skin. Clinical correlation is recommended to exclude retained foreign object. IMPRESSION: 1. No acute fracture or dislocation. No radiographic evidence of acute osteomyelitis. 2. Diffuse soft tissue edema. Small pockets of air in the medial foot, likely related to recent abscess drainage. Electronically Signed   By: Elgie Collard M.D.   On: 08/23/2020 02:41    Review of Systems Blood pressure (!) 164/91, pulse 88, temperature 99 F (37.2 C), temperature source Oral, resp. rate 16, height 6\' 2"  (1.88 m), weight (!) 240.4 kg, SpO2 94 %. Physical Exam General: AAO x3, NAD  Dermatological: Very  large blisters noted to the medial aspect of the right foot with clear drainage expressed.  I did deroofed the blister today given the drainage and I wanted to make sure there is no ongoing ulceration.  There is no probing wound noted.  There is no further fluctuation there  is no crepitation.  Significant edema and erythema noted to the foot and leg.  Vascular: Dorsalis Pedis artery and Posterior Tibial artery pedal pulses are difficult to palpate likely given swelling, obesity.  Neruologic: Sensation decreased with light touch.  Musculoskeletal: No significant tenderness on exam.  Assessment/Plan: Cellulitis, blister right foot  I did debride the majority of the blister today given the drainage.  There is no probing wound noted.  Clean the wound.  Absorbent dressing was applied followed by ABD pads, Kerlix.  Wound culture already obtained.  Gram-positive cocci growing.  For now continue with IV antibiotics.  I ordered an MRI however I received a message from radiology stating that given his weight they are not able to do the MRI.  I ordered arterial studies.  Will continue local wound care, IV antibiotics for now we will continue to monitor.  No immediate surgical plans for right now.  Podiatry will continue to follow.  Ovid Curd, DPM  Vivi Barrack 08/23/2020, 1:15 PM

## 2020-08-23 NOTE — ED Notes (Signed)
Pt put in hospital bed for comfort

## 2020-08-23 NOTE — Progress Notes (Signed)
PHARMACIST - PHYSICIAN COMMUNICATION  CONCERNING:  Enoxaparin (Lovenox) for DVT Prophylaxis    RECOMMENDATION: Patient was prescribed enoxaprin 40mg  q24 hours for VTE prophylaxis.   Filed Weights   08/22/20 2305  Weight: (!) 240.4 kg (530 lb)    Body mass index is 68.05 kg/m.  Estimated Creatinine Clearance: 159.2 mL/min (by C-G formula based on SCr of 1.13 mg/dL).   Based on [facility] policy patient is candidate for enoxaparin 0.5mg /kg TBW SQ every 24 hours based on BMI being >30.  DESCRIPTION: Pharmacy has adjusted enoxaparin dose per [facility] policy.  Patient is now receiving enoxaparin 120 mg every 24 hours  BMI 68; wt 240.4kg. Scr 1.13 (est CrCl 137mL/min) Will consider obtaining level given extremes of body weight. VTE ppx goal would be 0.2-0.4 un/mL.  47m, PharmD Clinical Pharmacist  08/23/2020 5:41 AM

## 2020-08-23 NOTE — ED Notes (Signed)
2LNC applied, as sats dipped to 84% while asleep

## 2020-08-23 NOTE — Progress Notes (Signed)
[facility] Progress Note  Patient: [name] [name]    OVF:[id number]  DOA: 08/23/2020     Date of Service: the patient was seen and examined on 08/23/2020  Chief Complaint  Patient presents with   Wound Infection   Brief hospital course: [name] [name] is a 50 y.o. male with medical history significant for DM and HTN who presents to the ED from the urgent care for management of right lower leg cellulitis not responding to outpatient Keflex.  Patient was in his usual state of health until he noticed a small red area on his right foot a week ago which progressively started getting larger.  He went to the urgent care 2 days later and got a prescription for Keflex however the area started extending going to the ankle and then up his lower leg and in the past couple days he developed a large blister on the medial aspect of his right foot for which she returned to the urgent care at which point they sent him to the ED for evaluation.  He denies fever but did have chills when he first went to the urgent care.  He denies cough, shortness of breath or chest pain.  Denies nausea, vomiting or abdominal pain or diarrhea.   ED course: On arrival temperature 99, BP 170/98 with pulse 96 and O2 sat 96% on room air Blood work with WBC 14,000 lactic acid 1.5, procalcitonin 0.27.  Troponin 11 and BNP 41.  Labs otherwise unremarkable   Imaging: Chest x-ray mild central vascular congestion without focal consolidation Venous Doppler right leg: Limited exam Right foot x-ray: No radiographic evidence of acute osteomyelitis, diffuse soft tissue edema   Assessment and Plan: 50 year old male with history of presenting with right leg cellulitis not responding to outpatient oral antibiotics.       Cellulitis of right lower leg with blister right foot - Vancomycin and cefepime - Keep leg elevated - Pain control - Podiatry consulted, s/p debridement of the blister, ordered arterial duplex and MRI - Wound care  consult    Bilateral lower extremity edema, could be secondary to sedentary lifestyle due to obesity versus CHF or liver cirrhosis which is not diagnosed Check BNP, TTE and CT abdomen to rule out liver cirrhosis     Hypertension - Resumed Coreg 25 twice daily home dose     Diabetes mellitus, type II - Sliding scale insulin     Morbid obesity with BMI of 60.0-69.9, adult,  daily exercise and calorie restricted diet advised to lose body weight - Complicating factor to overall prognosis and care     Diet: Heart healthy/carb modified DVT Prophylaxis: Subcutaneous Lovenox   Advance goals of care discussion: Full code  Family Communication: family was present at bedside, at the time of interview.  The pt provided permission to discuss medical plan with the family. Opportunity was given to ask question and all questions were answered satisfactorily.   Disposition:  Pt is from Home, admitted with RLE cellulitis, still has same, which precludes a safe discharge. Discharge to Home, when cleared by podiatry.  Subjective: No overnight events, patient presented with right lower extremity cellulitis, pain is under control now, work-up is pending.  Patient denied any active issues at this time.  Physical Exam: General:  alert oriented to time, place, and person.  Appear in mild distress, affect appropriate Eyes: PERRLA ENT: Oral Mucosa Clear, moist  Neck: no JVD,  Cardiovascular: S1 and S2 Present, no Murmur,  Respiratory: good respiratory effort, Bilateral  Air entry equal and Decreased, no Crackles, no wheezes Abdomen: Bowel Sound present, Soft, extremely obese, and no tenderness,  Skin: no rashes Extremities: RLE 4+  edema, erythema and tenderness, sloughing of skin noticed on the right foot, left lower extremity 2-3+ edema, no calf tenderness Neurologic: without any new focal findings Gait not checked due to patient safety concerns  Vitals:   08/23/20 0700 08/23/20 0800 08/23/20  1121 08/23/20 1200  BP: (!) 155/89 (!) 154/75 (!) 144/83 (!) 164/91  Pulse: 87 86 86 88  Resp: 18 18 16 16   Temp:      TempSrc:      SpO2: 93% 95% 96% 94%  Weight:      Height:        Intake/Output Summary (Last 24 hours) at 08/23/2020 1544 Last data filed at 08/23/2020 0352 Gross per 24 hour  Intake 200 ml  Output --  Net 200 ml   Filed Weights   08/22/20 2305  Weight: (!) 240.4 kg    Data Reviewed: I have personally reviewed and interpreted daily labs, tele strips, imagings as discussed above. I reviewed all nursing notes, pharmacy notes, vitals, pertinent old records I have discussed plan of care as described above with RN and patient/family.  CBC: Recent Labs  Lab 08/22/20 2310 08/23/20 0655  WBC 14.2* 14.6*  NEUTROABS 10.5*  --   HGB 12.1* 10.6*  HCT 37.4* 33.2*  MCV 91.2 89.7  PLT 434* 393   Basic Metabolic Panel: Recent Labs  Lab 08/22/20 2310 08/23/20 0655  NA 134*  --   K 3.7  --   CL 97*  --   CO2 24  --   GLUCOSE 148*  --   BUN 15  --   CREATININE 1.13 1.05  CALCIUM 9.1  --     Studies: DG Chest 2 View  Result Date: 08/22/2020 CLINICAL DATA:  Suspected sepsis. EXAM: CHEST - 2 VIEW COMPARISON:  None. FINDINGS: Mild cardiomegaly with mild central vascular congestion. No focal consolidation, pleural effusion, pneumothorax. No acute osseous pathology. IMPRESSION: Mild cardiomegaly with mild central vascular congestion. No focal consolidation. Electronically Signed   By: 08/24/2020 M.D.   On: 08/22/2020 23:31   08/24/2020 Venous Img Lower Unilateral Right (DVT)  Result Date: 08/23/2020 CLINICAL DATA:  Right foot pain and erythema EXAM: RIGHT LOWER EXTREMITY VENOUS DOPPLER ULTRASOUND TECHNIQUE: Gray-scale sonography with compression, as well as color and duplex ultrasound, were performed to evaluate the deep venous system(s) from the level of the common femoral vein through the popliteal and proximal calf veins. COMPARISON:  None. FINDINGS: VENOUS  Imaging is limited by the patient's body habitus. There is poor grayscale imaging of the venous structures of the right lower extremity. There is preserved antegrade flow and normal augmentation involving the right common femoral, femoral, central profundus femoral, and popliteal veins. The infrapopliteal venous vasculature is not well visualized. Limited views of the contralateral common femoral vein are unremarkable. OTHER None. Limitations: Imaging is significantly limited by the patient's body habitus and depth of the none venous structures which are only appreciated with color Doppler imaging. IMPRESSION: Markedly limited examination with preserved patency of the right femoropopliteal venous vasculature. If there is clinical suspicion of nonocclusive thrombus within the right lower extremity, CT or MR venography may be more helpful for further evaluation. Electronically Signed   By: 08/25/2020 M.D.   On: 08/23/2020 03:11   DG Foot Complete Right  Result Date: 08/23/2020 CLINICAL DATA:  Evaluate for osteomyelitis.  Abscess to the right foot. EXAM: RIGHT FOOT COMPLETE - 3+ VIEW COMPARISON:  None. FINDINGS: There is no acute fracture or dislocation. No bone erosion or periosteal elevation. Mild degenerative changes of the midfoot. There is diffuse soft tissue edema. Small pockets air in the superficial soft tissues of the medial foot may be related to recent abscess drainage. A 5 mm radiopaque focus over the plantar aspect of the midfoot may represent a wound in the skin. Clinical correlation is recommended to exclude retained foreign object. IMPRESSION: 1. No acute fracture or dislocation. No radiographic evidence of acute osteomyelitis. 2. Diffuse soft tissue edema. Small pockets of air in the medial foot, likely related to recent abscess drainage. Electronically Signed   By: Elgie Collard M.D.   On: 08/23/2020 02:41    Scheduled Meds:  enoxaparin (LOVENOX) injection  0.5 mg/kg Subcutaneous Q24H    insulin aspart  0-20 Units Subcutaneous TID WC   insulin aspart  0-5 Units Subcutaneous QHS   Continuous Infusions:  ceFEPime (MAXIPIME) IV 2 g (08/23/20 1500)   vancomycin     PRN Meds: acetaminophen **OR** acetaminophen, HYDROcodone-acetaminophen, ketorolac, ondansetron **OR** ondansetron (ZOFRAN) IV, senna-docusate  Time spent: 35 minutes  Author: Gillis Santa. MD [facility] 08/23/2020 3:44 PM  To reach On-call, see care teams to locate the attending and reach out to them via [email or web address]. If 7PM-7AM, please contact night-coverage If you still have difficulty reaching the attending provider, please page the [facility] (Director on Call) for [facility] on amion for assistance.

## 2020-08-23 NOTE — Consult Note (Signed)
Pharmacy Antibiotic Note  [name] [name] is a 50 y.o. male admitted on 08/23/2020 with cellulitis.  Pharmacy has been consulted for vancomycin & cefepime dosing.  Plan: BMI 68; wt 240.4kg. Scr 1.13 (est CrCl 149mL/min). NGTD in cultures. Ordered MRSA PCR to assist in antimicrobial narrowing. Cefepime 2g x1 in ED (8/20 0620); start cefepime 2g q8h (8/20 1400> Vancomycin 1g x1 in ED (8/20 ~0300). Will do 1.5g x1 now to complete loading dose; followed by vancomycin 2g q12h (8/20 1800> Predicted AUC 421, Cmax 27.2, Cmin 12.3 Calculated w/: IBW, Vd 0.5, Scr 1.13  Height: 6\' 2"  (188 cm) Weight: (!) 240.4 kg (530 lb) IBW/kg (Calculated) : 82.2  Temp (24hrs), Avg:99 F (37.2 C), Min:99 F (37.2 C), Max:99 F (37.2 C)  Recent Labs  Lab 08/22/20 2310 08/23/20 0236  WBC 14.2*  --   CREATININE 1.13  --   LATICACIDVEN 1.6 1.5    Estimated Creatinine Clearance: 159.2 mL/min (by C-G formula based on SCr of 1.13 mg/dL).    No Known Allergies  Antimicrobials this admission: vancomycin (8/20 >>  Cefepime (8/20 >>   Dose adjustments this admission: N/A  Microbiology results: 8/19 BCx: sent/pending 8/19 MRSA PCR: ordered  Thank you for allowing pharmacy to be a part of this patient's care.  9/19, Rph, B[facility] 08/23/2020 6:30 AM

## 2020-08-23 NOTE — ED Notes (Signed)
Area of redness and swelling to RLE marked. Dressing applied to R foot

## 2020-08-23 NOTE — ED Notes (Signed)
Patient updated on inpatient bed assignment.  

## 2020-08-23 NOTE — ED Notes (Signed)
Informed RN bed assigned 

## 2020-08-23 NOTE — ED Provider Notes (Signed)
[facility] Emergency Department Provider Note   ____________________________________________   Event Date/Time   First MD Initiated Contact with Patient 08/23/20 0157     (approximate)  I have reviewed the triage vital signs and the nursing notes.   HISTORY  Chief Complaint Wound Infection    HPI Denario Bagot is a 50 y.o. male who presents to the ED from home with a chief complaint of right lower leg cellulitis.  Patient with a history of diabetes who went to urgent care on Tuesday and started Keflex for right lower leg cellulitis.  That evening he began to experience chills.  The following day patient noticed blister to the inside and top of right foot.  He returned to urgent care yesterday to have the blister lanced.  Reports increased redness and swelling since lancing.  Denies fever, cough, chest pain, shortness of breath, abdominal pain, nausea, vomiting or dizziness.     Past medical history Diabetes  There are no problems to display for this patient.    Prior to Admission medications   Not on File    Allergies Patient has no known allergies.  No family history on file.  Social History    Review of Systems  Constitutional: Positive for chills Eyes: No visual changes. ENT: No sore throat. Cardiovascular: Denies chest pain. Respiratory: Denies shortness of breath. Gastrointestinal: No abdominal pain.  No nausea, no vomiting.  No diarrhea.  No constipation. Genitourinary: Negative for dysuria. Musculoskeletal: Positive for RLE pain, redness and swelling.  Negative for back pain. Skin: Negative for rash. Neurological: Negative for headaches, focal weakness or numbness.   ____________________________________________   PHYSICAL EXAM:  VITAL SIGNS: ED Triage Vitals  Enc Vitals Group     BP 08/22/20 2304 (!) 170/98     Pulse Rate 08/22/20 2304 96     Resp 08/22/20 2304 18     Temp 08/22/20 2304 99 F (37.2 C)     Temp  Source 08/22/20 2304 Oral     SpO2 08/22/20 2304 96 %     Weight 08/22/20 2305 (!) 530 lb (240.4 kg)     Height 08/22/20 2305 6\' 2"  (1.88 m)     Head Circumference --      Peak Flow --      Pain Score 08/22/20 2304 0     Pain Loc --      Pain Edu? --      Excl. in GC? --     Constitutional: Alert and oriented. Well appearing and in mild acute distress. Eyes: Conjunctivae are normal. PERRL. EOMI. Head: Atraumatic. Nose: No congestion/rhinnorhea. Mouth/Throat: Mucous membranes are moist.  Neck: No stridor.   Cardiovascular: Normal rate, regular rhythm. Grossly normal heart sounds.  Good peripheral circulation. Respiratory: Normal respiratory effort.  No retractions. Lungs CTAB. Gastrointestinal: Soft and nontender. No distention. No abdominal bruits. No CVA tenderness. Musculoskeletal:  RLE: Warmth, erythema and swelling to mid calf.  Venous stasis ulcers.  Large blister to medial dorsal foot and instep which is lanced at the inferior aspect.  Palpable distal pulses.  Brisk, less than 5-second capillary refill.    Neurologic:  Normal speech and language. No gross focal neurologic deficits are appreciated.  Skin:  Skin is warm, dry and intact. No rash noted. Psychiatric: Mood and affect are normal. Speech and behavior are normal.  ____________________________________________   LABS (all labs ordered are listed, but only abnormal results are displayed)  Labs Reviewed  COMPREHENSIVE METABOLIC PANEL - Abnormal; Notable  for the following components:      Result Value   Sodium 134 (*)    Chloride 97 (*)    Glucose, Bld 148 (*)    Total Protein 8.8 (*)    Albumin 3.2 (*)    All other components within normal limits  CBC WITH DIFFERENTIAL/PLATELET - Abnormal; Notable for the following components:   WBC 14.2 (*)    RBC 4.10 (*)    Hemoglobin 12.1 (*)    HCT 37.4 (*)    Platelets 434 (*)    Neutro Abs 10.5 (*)    Monocytes Absolute 1.1 (*)    Abs Immature Granulocytes 0.16 (*)     All other components within normal limits  CULTURE, BLOOD (ROUTINE X 2)  AEROBIC/ANAEROBIC CULTURE W GRAM STAIN (SURGICAL/DEEP WOUND)  CULTURE, BLOOD (ROUTINE X 2) W REFLEX TO ID PANEL  LACTIC ACID, PLASMA  LACTIC ACID, PLASMA  PROTIME-INR  BRAIN NATRIURETIC PEPTIDE  PROCALCITONIN  URINALYSIS, COMPLETE (UACMP) WITH MICROSCOPIC  TROPONIN I (HIGH SENSITIVITY)   ____________________________________________  EKG  None ____________________________________________  RADIOLOGY I, Maybelline Kolarik J, personally viewed and evaluated these images (plain radiographs) as part of my medical decision making, as well as reviewing the written report by the radiologist.  ED MD interpretation: Chest x-ray with mild central vascular congestion; right foot x-ray without osteomyelitis, diffuse soft tissue edema noted; limited ultrasound, no DVT noted  Official radiology report(s): DG Chest 2 View  Result Date: 08/22/2020 CLINICAL DATA:  Suspected sepsis. EXAM: CHEST - 2 VIEW COMPARISON:  None. FINDINGS: Mild cardiomegaly with mild central vascular congestion. No focal consolidation, pleural effusion, pneumothorax. No acute osseous pathology. IMPRESSION: Mild cardiomegaly with mild central vascular congestion. No focal consolidation. Electronically Signed   By: Elgie Collard M.D.   On: 08/22/2020 23:31   US Venous Img Lower Unilateral Right (DVT)  Result Date: 08/23/2020 CLINICAL DATA:  Right foot pain and erythema EXAM: RIGHT LOWER EXTREMITY VENOUS DOPPLER ULTRASOUND TECHNIQUE: Gray-scale sonography with compression, as well as color and duplex ultrasound, were performed to evaluate the deep venous system(s) from the level of the common femoral vein through the popliteal and proximal calf veins. COMPARISON:  None. FINDINGS: VENOUS Imaging is limited by the patient's body habitus. There is poor grayscale imaging of the venous structures of the right lower extremity. There is preserved antegrade flow and  normal augmentation involving the right common femoral, femoral, central profundus femoral, and popliteal veins. The infrapopliteal venous vasculature is not well visualized. Limited views of the contralateral common femoral vein are unremarkable. OTHER None. Limitations: Imaging is significantly limited by the patient's body habitus and depth of the none venous structures which are only appreciated with color Doppler imaging. IMPRESSION: Markedly limited examination with preserved patency of the right femoropopliteal venous vasculature. If there is clinical suspicion of nonocclusive thrombus within the right lower extremity, CT or MR venography may be more helpful for further evaluation. Electronically Signed   By: Helyn Numbers M.D.   On: 08/23/2020 03:11   DG Foot Complete Right  Result Date: 08/23/2020 CLINICAL DATA:  Evaluate for osteomyelitis. Abscess to the right foot. EXAM: RIGHT FOOT COMPLETE - 3+ VIEW COMPARISON:  None. FINDINGS: There is no acute fracture or dislocation. No bone erosion or periosteal elevation. Mild degenerative changes of the midfoot. There is diffuse soft tissue edema. Small pockets air in the superficial soft tissues of the medial foot may be related to recent abscess drainage. A 5 mm radiopaque focus over the plantar aspect of  the midfoot may represent a wound in the skin. Clinical correlation is recommended to exclude retained foreign object. IMPRESSION: 1. No acute fracture or dislocation. No radiographic evidence of acute osteomyelitis. 2. Diffuse soft tissue edema. Small pockets of air in the medial foot, likely related to recent abscess drainage. Electronically Signed   By: Elgie Collard M.D.   On: 08/23/2020 02:41    ____________________________________________   PROCEDURES  Procedure(s) performed (including Critical Care):  Procedures   ____________________________________________   INITIAL IMPRESSION / ASSESSMENT AND PLAN / ED COURSE  As part of my  medical decision making, I reviewed the following data within the electronic MEDICAL RECORD NUMBER History obtained from family, Nursing notes reviewed and incorporated, Labs reviewed, Old chart reviewed, Radiograph reviewed, Discussed with admitting physician, and Notes from prior ED visits     50 year old male presenting with right lower leg cellulitis and blister, on Keflex x3 days.  Differential diagnosis includes but is not limited to cellulitis, osteomyelitis, diabetic ulcer, DVT, etc.  Laboratory results demonstrate moderate leukocytosis.  Will obtain plain film imaging to evaluate for osteomyelitis, DVT ultrasounds.  Administer morphine for pain, vancomycin.  Anticipate admission for worsening cellulitis failed outpatient antibiotic.  Clinical Course as of 08/23/20 0426  Sat Aug 23, 2020  0425 Updated patient and spouse on imaging studies; will discuss with hospitalist services for admission. [JS]    Clinical Course User Index [JS] Irean Hong, MD     ____________________________________________   FINAL CLINICAL IMPRESSION(S) / ED DIAGNOSES  Final diagnoses:  Cellulitis of right lower extremity  Blister     ED Discharge Orders     None        Note:  This document was prepared using Dragon voice recognition software and may include unintentional dictation errors.    Irean Hong, MD 08/23/20 [phone]

## 2020-08-23 NOTE — ED Notes (Signed)
Patient reports he wears oxygen at home for sleep. Patient reports wanting to take a nap at this time. Oxygen at 2L via [place] placed for patient comfort with sleep. No further needs expressed by patient at this time.

## 2020-08-24 ENCOUNTER — Inpatient Hospital Stay (HOSPITAL_COMMUNITY)
Admit: 2020-08-24 | Discharge: 2020-08-24 | Disposition: A | Discharge status: Home / Self Care | Payer: BC Managed Care – PPO | Attending: Student | Admitting: Student

## 2020-08-24 DIAGNOSIS — R0609 Other forms of dyspnea: Secondary | ICD-10-CM | POA: Diagnosis not present

## 2020-08-24 DIAGNOSIS — L03115 Cellulitis of right lower limb: Secondary | ICD-10-CM | POA: Diagnosis not present

## 2020-08-24 DIAGNOSIS — E1169 Type 2 diabetes mellitus with other specified complication: Secondary | ICD-10-CM | POA: Diagnosis not present

## 2020-08-24 DIAGNOSIS — I159 Secondary hypertension, unspecified: Secondary | ICD-10-CM

## 2020-08-24 DIAGNOSIS — Z6841 Body Mass Index (BMI) 40.0 and over, adult: Secondary | ICD-10-CM

## 2020-08-24 LAB — GLUCOSE, CAPILLARY
Glucose-Capillary: 131 mg/dL — ABNORMAL HIGH (ref 70–99)
Glucose-Capillary: 173 mg/dL — ABNORMAL HIGH (ref 70–99)
Glucose-Capillary: 151 mg/dL — ABNORMAL HIGH (ref 70–99)
Glucose-Capillary: 159 mg/dL — ABNORMAL HIGH (ref 70–99)

## 2020-08-24 LAB — ECHOCARDIOGRAM COMPLETE
Height: 74 in
S' Lateral: 3.17 cm
Weight: 9280 oz

## 2020-08-24 LAB — HIV ANTIBODY (ROUTINE TESTING W REFLEX): HIV Screen 4th Generation wRfx: NONREACTIVE

## 2020-08-24 MED ORDER — METOPROLOL TARTRATE 5 MG/5ML IV SOLN: 5.0000 mg | INTRAVENOUS | Status: DC | PRN

## 2020-08-24 MED ORDER — HYDRALAZINE HCL 20 MG/ML IJ SOLN: 10.0000 mg | INTRAMUSCULAR | Status: DC | PRN

## 2020-08-24 MED ORDER — TRAZODONE HCL 50 MG PO TABS: 50.0000 mg | ORAL_TABLET | Freq: Every evening | ORAL | Status: DC | PRN

## 2020-08-24 MED ORDER — OXYCODONE HCL 5 MG PO TABS
5.0000 mg | ORAL_TABLET | ORAL | Status: DC | PRN
  Administered 2020-08-25: 5 mg via ORAL
  Filled 2020-08-24: qty 1

## 2020-08-24 MED ORDER — IPRATROPIUM-ALBUTEROL 0.5-2.5 (3) MG/3ML IN SOLN
3.0000 mL | RESPIRATORY_TRACT | Status: DC | PRN
Start: 2020-08-24 — End: 2020-08-24

## 2020-08-24 MED ORDER — IPRATROPIUM BROMIDE HFA 17 MCG/ACT IN AERS: 2.0000 | INHALATION_SPRAY | Freq: Four times a day (QID) | RESPIRATORY_TRACT | Status: DC

## 2020-08-24 MED ORDER — IPRATROPIUM BROMIDE 0.02 % IN SOLN: 0.5000 mg | Freq: Four times a day (QID) | RESPIRATORY_TRACT | Status: DC | PRN

## 2020-08-24 MED ORDER — PERFLUTREN LIPID MICROSPHERE
1.0000 mL | INTRAVENOUS | Status: AC | PRN
  Administered 2020-08-24: 5 mL via INTRAVENOUS
  Filled 2020-08-24: qty 10

## 2020-08-24 MED ORDER — IPRATROPIUM BROMIDE 0.02 % IN SOLN
0.5000 mg | Freq: Four times a day (QID) | RESPIRATORY_TRACT | Status: DC
  Filled 2020-08-24 (×2): qty 2.5

## 2020-08-24 NOTE — Progress Notes (Signed)
Subjective: 50 year old male admitted to the hospital for worsening cellulitis of the right foot as well as significantly large blister on the medial aspect of the foot.  He was admitted after failure of oral antibiotics.  His wife is at bedside.  He said he is feeling better today and the swelling and the redness of his leg is improved.  Current denies any fevers or chills.  No significant pain.   Objective: AAO x3, NAD DP/PT pulses palpable bilaterally, CRT less than 3 seconds There is still edema and erythema to the right leg worse than left.  However it does appear to be improved.  There is a large blister the medial aspect the foot is also improved.  There is new skin present along the periphery of the blister area and there is a small amount of fibrotic tissue remaining.  There is no areas of fluctuation crepitation. No pain with calf compression, swelling, warmth, erythema  Assessment: Cellulitis right leg with blister, improving  Plan: Unfortunately not able to get MRI.  I was able to palpate his DP pulses today as the swelling has improved.  ABI still ordered.  Recommend continue local wound care, IV antibiotics for now.  No immediate surgical plans noted.  Today I applied an absorptive dressing followed by ABD pad, Kerlix, Ace.  Encouraged elevation as well.  Weight-bear as tolerated.   Ovid Curd, DPM

## 2020-08-24 NOTE — Evaluation (Signed)
Physical Therapy Evaluation Patient Details Name: [name] [name] MRN: [id number] DOB: 1970/03/14 Today's Date: 08/24/2020   History of Present Illness  Pt is a 50 y/o M admitted on 08/23/20 from urgent care for worsening RLE cellulitis who failed outpatient keflex. X-ray was negative for osteomyelitis, chest x-ray showed mild central vascular congestion. Pt is s/p R foot debridement. PMH: DM2, HTN  Clinical Impression  Pt seen for PT evaluation with co-tx with OT. Pt reports prior to admission he was independent without AD, driving for work. On this date, pt is able to complete bed mobility & transfers with mod I & ambulate short distance in room without AD with supervision. Pt does demonstrate increased pain RLE with weight bearing. Did not have appropriate RW present for session but educated pt & wife on use of AD for pain management - recommended bariatric RW & pt & family agreeable. Will continue to follow pt acutely for further gait training & stair training. Do not anticipate pt will require f/u after d/c.     Follow Up Recommendations No PT follow up;Supervision - Intermittent    Equipment Recommendations  Rolling walker with 5" wheels (bariatric RW)    Recommendations for Other Services       Precautions / Restrictions Precautions Precautions: Fall Restrictions Weight Bearing Restrictions: Yes RLE Weight Bearing: Weight bearing as tolerated      Mobility  Bed Mobility Overal bed mobility: Modified Independent             General bed mobility comments: supine>sit with HOB elevated, bed rails    Transfers Overall transfer level: Modified independent Equipment used: None             General transfer comment: sit<>stand from EOB with pt pushing on bed rails to stand  Ambulation/Gait Ambulation/Gait assistance: Supervision Gait Distance (Feet): 18 Feet Assistive device: None Gait Pattern/deviations: Wide base of support;Decreased weight shift to  right;Decreased step length - left;Decreased step length - right;Decreased stride length Gait velocity: decreased      Stairs            Wheelchair Mobility    Modified Rankin (Stroke Patients Only)       Balance Overall balance assessment: Needs assistance Sitting-balance support: Feet supported;No upper extremity supported Sitting balance-Leahy Scale: Normal     Standing balance support: No upper extremity supported;During functional activity Standing balance-Leahy Scale: Good                               Pertinent Vitals/Pain Pain Assessment: Faces Faces Pain Scale: Hurts a little bit Pain Location: RLE with gait Pain Descriptors / Indicators: Discomfort;Grimacing Pain Intervention(s): Limited activity within patient's tolerance;Monitored during session    Home Living Family/patient expects to be discharged to:: Private residence Living Arrangements: Spouse/significant other;Children (86 y/o son) Available Help at Discharge: Family Type of Home: House Home Access: Stairs to enter Entrance Stairs-Rails:  (no rails in front, rail(s) in back) Entrance Stairs-Number of Steps: 2 Home Layout: One level Home Equipment: None      Prior Function Level of Independence: Independent         Comments: driving (for work)     Higher education careers adviser        Extremity/Trunk Assessment   Upper Extremity Assessment Upper Extremity Assessment: Overall WFL for tasks assessed    Lower Extremity Assessment Lower Extremity Assessment: Overall WFL for tasks assessed;Generalized weakness       Communication  Communication: No difficulties  Cognition Arousal/Alertness: Awake/alert Behavior During Therapy: WFL for tasks assessed/performed Overall Cognitive Status: Within Functional Limits for tasks assessed                                        General Comments General comments (skin integrity, edema, etc.): Discussed need to wear  appropriate footwear 2/2 hx of DM, as well as foot/skin inspections daily. Discussed use of backdoor with railings to access house vs front door without rails. Educated pt & wife on use of RW for RLE pain management.    Exercises     Assessment/Plan    PT Assessment Patient needs continued PT services  PT Problem List Decreased mobility;Decreased strength;Decreased activity tolerance;Decreased balance;Decreased skin integrity       PT Treatment Interventions DME instruction;Therapeutic activities;Modalities;Gait training;Therapeutic exercise;Patient/family education;Stair training;Balance training;Neuromuscular re-education;Functional mobility training;Manual techniques    PT Goals (Current goals can be found in the Care Plan section)  Acute Rehab PT Goals Patient Stated Goal: go home, foot to heal PT Goal Formulation: With patient/family Time For Goal Achievement: 09/07/20 Potential to Achieve Goals: Good    Frequency Min 2X/week   Barriers to discharge        Co-evaluation PT/OT/SLP Co-Evaluation/Treatment: Yes Reason for Co-Treatment: For patient/therapist safety PT goals addressed during session: Mobility/safety with mobility;Proper use of DME;Balance         AM-PAC PT "6 Clicks" Mobility  Outcome Measure Help needed turning from your back to your side while in a flat bed without using bedrails?: None Help needed moving from lying on your back to sitting on the side of a flat bed without using bedrails?: None Help needed moving to and from a bed to a chair (including a wheelchair)?: A Little Help needed standing up from a chair using your arms (e.g., wheelchair or bedside chair)?: None Help needed to walk in hospital room?: A Little Help needed climbing 3-5 steps with a railing? : A Little 6 Click Score: 21    End of Session   Activity Tolerance: Patient tolerated treatment well Patient left: in bed;with family/visitor present (sitting EOB)   PT Visit Diagnosis:  Muscle weakness (generalized) (M62.81);Unsteadiness on feet (R26.81)    Time: 9476-5465 PT Time Calculation (min) (ACUTE ONLY): 22 min   Charges:   PT Evaluation $PT Eval Low Complexity: 1 Low          Aleda Grana, PT, DPT 08/24/20, 3:12 PM   Sandi Mariscal 08/24/2020, 3:11 PM

## 2020-08-24 NOTE — Progress Notes (Signed)
*  PRELIMINARY RESULTS* Echocardiogram 2D Echocardiogram has been performed. Definity IV Contrast was used on this study.  [name] [name] 08/24/2020, 10:59 AM

## 2020-08-24 NOTE — Progress Notes (Signed)
Patient seen for svn and cpap setup. Patient on room air in no distress. Questions why is having to take breathing tx as he his admitted for other issues. He does not use any type of respiratory meds or devices at home. Lungs clear. Svn changed to prn. Cpap removed from room. Patient preferred o2 2l over cpap since he has never used a mask before. He stated he could tell difference in breathing when used o2 at night in er.

## 2020-08-24 NOTE — Evaluation (Signed)
Occupational Therapy Evaluation Patient Details Name: [name] [name] MRN: [id number] DOB: [birth year] Today's Date: 08/24/2020    History of Present Illness Pt is a 50 y/o M admitted on 08/23/20 from urgent care for worsening RLE cellulitis who failed outpatient keflex. X-ray was negative for osteomyelitis, chest x-ray showed mild central vascular congestion. Pt is s/p R foot debridement. PMH: DM2, HTN   Clinical Impression   Pt seen for OT evaluation this date in setting of acute hospitalziation d/t R foot cellulitis s/p debridement. Pt reports being MOD I at baseline for ADLs/ADL mobility including furniture walking in the home. Pt presents this date with some expected post-op pain superimposed on baseline weakness, impacting his efficiency with self care tasks. Pt does requires increased time and SBA for transfers and fxl mobility, but no physical assistance and is at baseline for LB ADLs (endorses difficulty at baseline 2/2 body habitus). No further Acute OT needs detected at this time. Pt safe to return home with family support from OT standpoint. Recommend below-listed equipment.    Follow Up Recommendations  No OT follow up    Equipment Recommendations  3 in 1 bedside commode;Other (comment) (bari BSC, Bari front wheel walker (>500#))    Recommendations for Other Services       Precautions / Restrictions Precautions Precautions: Fall Restrictions Weight Bearing Restrictions: Yes RLE Weight Bearing: Weight bearing as tolerated      Mobility Bed Mobility Overal bed mobility: Modified Independent             General bed mobility comments: supine>sit with HOB elevated, bed rails    Transfers Overall transfer level: Modified independent Equipment used: None             General transfer comment: sit<>stand from EOB with pt pushing on bed rails to stand    Balance Overall balance assessment: Needs assistance Sitting-balance support: Feet supported;No upper  extremity supported Sitting balance-Leahy Scale: Normal     Standing balance support: No upper extremity supported;During functional activity Standing balance-Leahy Scale: Good                             ADL either performed or assessed with clinical judgement   ADL Overall ADL's : At baseline                                       General ADL Comments: able to perform UB ADLs in sitting with INDEP. Endorses difficulty with LB ADLs at baseline d/t body habitus. Pt requires some assist from spouse with socks, but states at baseline, he usually doesn't wear socks and opts for slip-on shoes like slides or crocs. Able to simulate donning his slip on shoes while seated EOB with SETUP only, his shoes are not big enough to get around wound dressing at thsi time. Recommend sizing up.     Vision Patient Visual Report: No change from baseline       Perception     Praxis      Pertinent Vitals/Pain Pain Assessment: Faces Faces Pain Scale: Hurts a little bit Pain Location: RLE with gait Pain Descriptors / Indicators: Discomfort;Grimacing;Aching Pain Intervention(s): Limited activity within patient's tolerance;Monitored during session     Hand Dominance     Extremity/Trunk Assessment Upper Extremity Assessment Upper Extremity Assessment: Overall WFL for tasks assessed   Lower Extremity Assessment Lower Extremity Assessment:  Overall [facility] for tasks assessed;Generalized weakness       Communication Communication Communication: No difficulties   Cognition Arousal/Alertness: Awake/alert Behavior During Therapy: WFL for tasks assessed/performed Overall Cognitive Status: Within Functional Limits for tasks assessed                                     General Comments  Discussed need to wear appropriate footwear 2/2 hx of DM, as well as foot/skin inspections daily. Discussed use of backdoor with railings to access house vs front door without  rails. Educated pt & wife on use of RW for RLE pain management.    Exercises Other Exercises Other Exercises: OT ed re: role of OT, shoe sizing up to accomodate wrapping, open shoe to allow wound to breathe/heal, ed re: importance of checking feet daily.   Shoulder Instructions      Home Living Family/patient expects to be discharged to:: Private residence Living Arrangements: Spouse/significant other;Children (71 y/o son) Available Help at Discharge: Family Type of Home: House Home Access: Stairs to enter Secretary/administrator of Steps: 2 Entrance Stairs-Rails:  (no rails in front, rail(s) in back) Home Layout: One level               Home Equipment: None          Prior Functioning/Environment Level of Independence: Independent        Comments: driving (for work)        OT Problem List: Decreased strength;Decreased activity tolerance      OT Treatment/Interventions: Self-care/ADL training;Therapeutic activities    OT Goals(Current goals can be found in the care plan section) Acute Rehab OT Goals Patient Stated Goal: go home, foot to heal OT Goal Formulation: All assessment and education complete, DC therapy  OT Frequency:     Barriers to D/C:            Co-evaluation PT/OT/SLP Co-Evaluation/Treatment: Yes Reason for Co-Treatment: For patient/therapist safety PT goals addressed during session: Mobility/safety with mobility;Proper use of DME OT goals addressed during session: ADL's and self-care;Proper use of Adaptive equipment and DME      AM-PAC OT "6 Clicks" Daily Activity     Outcome Measure Help from another person eating meals?: None Help from another person taking care of personal grooming?: None Help from another person toileting, which includes using toliet, bedpan, or urinal?: None Help from another person bathing (including washing, rinsing, drying)?: None Help from another person to put on and taking off regular upper body clothing?:  None Help from another person to put on and taking off regular lower body clothing?: A Little 6 Click Score: 23   End of Session Equipment Utilized During Treatment: Gait belt Nurse Communication: Mobility status  Activity Tolerance: Patient tolerated treatment well Patient left: Other (comment);with family/visitor present;with call bell/phone within reach (seated EOB)  OT Visit Diagnosis: Muscle weakness (generalized) (M62.81);Pain Pain - Right/Left: Right Pain - part of body: Ankle and joints of foot                Time: 1354-1416 OT Time Calculation (min): 22 min Charges:  OT General Charges $OT Visit: 1 Visit OT Evaluation $OT Eval Low Complexity: 1 Low  Rejeana Brock, MS, OTR/L ascom [phone] 08/24/20, 4:21 PM

## 2020-08-24 NOTE — Progress Notes (Signed)
PROGRESS NOTE    [name] [name]  ZOX:[id number] DOB: April 11, 1970 DOA: 08/23/2020 PCP: Lauro Regulus, MD   Brief Narrative:  50 year old with history of DM2, HTN admitted from urgent care for worsening right lower extremity cellulitis who failed outpatient Keflex.  He was diagnosed with worsening cellulitis, x-ray was negative for osteomyelitis.  Venous Doppler was limited for exam.  Chest x-ray showed mild central vascular congestion.  Started on IV vancomycin and admitted to the hospital.   Assessment & Plan:   Active Problems:   Cellulitis of right lower leg   Hypertension   Diabetes mellitus, type II (HCC)   Morbid obesity with BMI of 60.0-69.9, adult (HCC)   Cellulitis of right lower leg with blister right foot status post debridement by podiatry -Status postdebridement.  Podiatry following - MRI-unable to perform given his weight -ABI- - Antibiotics-vancomycin, cefepime - Follow-up culture data  Bilateral lower extremity swelling - Echocardiogram-pending - BNP normal but he is quite obese therefore could be unreliable - CT abdomen pelvis shows hepatic steatosis     Hypertension - Coreg 25 mg twice daily.  IV hydralazine as needed/Lopressor ordered     Diabetes mellitus, type II (HCC) - Sliding scale Accu-Chek - A1c- pending.      Morbid obesity with BMI of 60.0-69.9, adult (HCC) - Complicating factor to overall prognosis and care   Patient and family describes that he has some fatigue, middle night awakening concerns of hypoxia.  I suspect he may have underlying sleep apnea.  Will order CPAP here but he will need formal outpatient sleep study.   DVT prophylaxis: Lovenox Code Status: Full Family Communication: Wife is present at bedside  Status is: Inpatient  Remains inpatient appropriate because:IV treatments appropriate due to intensity of illness or inability to take PO  Dispo: The patient is from: Home              Anticipated d/c is to: Home               Patient currently is not medically stable to d/c.  Keep him in the hospital for at least next 24-48 hours for aggressive IV antibiotic therapy.  In the meantime podiatry team is following.  Hopefully we can discharge him thereafter on p.o. antibiotics   Difficult to place patient No        Subjective: Seen and examined at bedside still reports of right lower extremity discomfort.  Even his wife also reports to me that he has been having episodes of hypoxia multiple awakening and feeling fatigue He has not been formally diagnosed with sleep apnea:   Review of Systems Otherwise negative except as per HPI, including: General: Denies fever, chills, night sweats or unintended weight loss. Resp: Denies cough, wheezing, shortness of breath. Cardiac: Denies chest pain, palpitations, orthopnea, paroxysmal nocturnal dyspnea. GI: Denies abdominal pain, nausea, vomiting, diarrhea or constipation GU: Denies dysuria, frequency, hesitancy or incontinence MS: Denies muscle aches, joint pain or swelling Neuro: Denies headache, neurologic deficits (focal weakness, numbness, tingling), abnormal gait Psych: Denies anxiety, depression, SI/HI/AVH Skin: Denies new rashes or lesions Denies sick contacts, exotic exposures, travel  Examination:  General exam: Appears calm and comfortable  Respiratory system: Clear to auscultation. Respiratory effort normal. Cardiovascular system: S1 & S2 heard, RRR. No JVD, murmurs, rubs, gallops or clicks.  Bilateral lower extremity 2+ pitting edema Gastrointestinal system: Abdomen is nondistended, soft and nontender. No organomegaly or masses felt. Normal bowel sounds heard. Central nervous system: Alert and oriented. No focal  neurological deficits. Extremities: Symmetric 5 x 5 power. Skin: Bilateral lower extremity chronic skin changes.  Right lower extremity dressing noted with slight warmth around his dressing site Psychiatry: Judgement and insight appear  normal. Mood & affect appropriate.     Objective: Vitals:   08/23/20 1653 08/23/20 2004 08/24/20 0338 08/24/20 0756  BP: (!) 148/59 (!) 147/87 (!) 135/93 (!) 152/79  Pulse: 84 83 83 88  Resp: 16 20 20 20   Temp:  98.1 F (36.7 C) 97.7 F (36.5 C) 98.1 F (36.7 C)  TempSrc:      SpO2: 99% 96% 94% 95%  Weight:  (!) 263.1 kg    Height:  6\' 2"  (1.88 m)      Intake/Output Summary (Last 24 hours) at 08/24/2020 0946 Last data filed at 08/24/2020 0020 Gross per 24 hour  Intake 600 ml  Output --  Net 600 ml   Filed Weights   08/22/20 2305 08/23/20 2004  Weight: (!) 240.4 kg (!) 263.1 kg     Data Reviewed:   CBC: Recent Labs  Lab 08/22/20 2310 08/23/20 0655  WBC 14.2* 14.6*  NEUTROABS 10.5*  --   HGB 12.1* 10.6*  HCT 37.4* 33.2*  MCV 91.2 89.7  PLT 434* 393   Basic Metabolic Panel: Recent Labs  Lab 08/22/20 2310 08/23/20 0655  NA 134*  --   K 3.7  --   CL 97*  --   CO2 24  --   GLUCOSE 148*  --   BUN 15  --   CREATININE 1.13 1.05  CALCIUM 9.1  --    GFR: Estimated Creatinine Clearance: 182 mL/min (by C-G formula based on SCr of 1.05 mg/dL). Liver Function Tests: Recent Labs  Lab 08/22/20 2310  AST 22  ALT 21  ALKPHOS 94  BILITOT 0.5  PROT 8.8*  ALBUMIN 3.2*   No results for input(s): LIPASE, AMYLASE in the last 168 hours. No results for input(s): AMMONIA in the last 168 hours. Coagulation Profile: Recent Labs  Lab 08/22/20 2310  INR 1.1   Cardiac Enzymes: No results for input(s): CKTOTAL, CKMB, CKMBINDEX, TROPONINI in the last 168 hours. BNP (last 3 results) No results for input(s): PROBNP in the last 8760 hours. HbA1C: Recent Labs    08/23/20 0655  HGBA1C 7.4*   CBG: Recent Labs  Lab 08/23/20 0744 08/23/20 1123 08/23/20 1803 08/23/20 2108 08/24/20 0837  GLUCAP 144* 155* 126* 151* 131*   Lipid Profile: No results for input(s): CHOL, HDL, LDLCALC, TRIG, CHOLHDL, LDLDIRECT in the last 72 hours. Thyroid Function Tests: No  results for input(s): TSH, T4TOTAL, FREET4, T3FREE, THYROIDAB in the last 72 hours. Anemia Panel: No results for input(s): VITAMINB12, FOLATE, FERRITIN, TIBC, IRON, RETICCTPCT in the last 72 hours. Sepsis Labs: Recent Labs  Lab 08/22/20 2310 08/23/20 0236  PROCALCITON 0.27  --   LATICACIDVEN 1.6 1.5    Recent Results (from the past 240 hour(s))  Culture, blood (Routine x 2)     Status: None (Preliminary result)   Collection Time: 08/22/20 11:10 PM   Specimen: BLOOD  Result Value Ref Range Status   Specimen Description BLOOD BLOOD LEFT HAND  Final   Special Requests   Final    BOTTLES DRAWN AEROBIC AND ANAEROBIC Blood Culture results may not be optimal due to an excessive volume of blood received in culture bottles   Culture   Final    NO GROWTH 2 DAYS Performed at [facility], [address] [place], [address] [place]  Report Status PENDING  Incomplete  Aerobic/Anaerobic Culture w Gram Stain (surgical/deep wound)     Status: Abnormal (Preliminary result)   Collection Time: 08/23/20  2:36 AM   Specimen: Foot  Result Value Ref Range Status   Specimen Description   Final    FOOT Performed at [facility], [address] [place], [place] [address]    Special Requests   Final    NONE Performed at [facility], [address] [place], [place] [address]    Gram Stain   Final    RARE WBC PRESENT,BOTH PMN AND MONONUCLEAR RARE GRAM POSITIVE COCCI IN PAIRS Performed at [facility], [address], [place], [place] [address]    Culture MULTIPLE ORGANISMS PRESENT, NONE PREDOMINANT (A)  Final   Report Status PENDING  Incomplete  Culture, blood (Routine X 2) w Reflex to ID Panel     Status: None (Preliminary result)   Collection Time: 08/23/20  2:36 AM   Specimen: BLOOD  Result Value Ref Range Status   Specimen Description BLOOD RIGHT ASSIST CONTROL  Final   Special Requests   Final    BOTTLES DRAWN AEROBIC AND ANAEROBIC Blood Culture  adequate volume   Culture   Final    NO GROWTH 1 DAY Performed at [facility], [address], [place], [place] [address]    Report Status PENDING  Incomplete  MRSA Next Gen by PCR, Nasal     Status: None   Collection Time: 08/23/20  8:24 AM   Specimen: Nasopharyngeal Swab; Nasal Swab  Result Value Ref Range Status   MRSA by PCR Next Gen NOT DETECTED NOT DETECTED Final    Comment: (NOTE) The GeneXpert MRSA Assay (FDA approved for NASAL specimens only), is one component of a comprehensive MRSA colonization surveillance program. It is not intended to diagnose MRSA infection nor to guide or monitor treatment for MRSA infections. Test performance is not FDA approved in patients less than 76 years old. Performed at [facility], [address] [place], [place] [address]   SARS CORONAVIRUS 2 (TAT 6-24 HRS) Nasopharyngeal Nasopharyngeal Swab     Status: None   Collection Time: 08/23/20  8:24 AM   Specimen: Nasopharyngeal Swab  Result Value Ref Range Status   SARS Coronavirus 2 NEGATIVE NEGATIVE Final    Comment: (NOTE) SARS-CoV-2 target nucleic acids are NOT DETECTED.  The SARS-CoV-2 RNA is generally detectable in upper and lower respiratory specimens during the acute phase of infection. Negative results do not preclude SARS-CoV-2 infection, do not rule out co-infections with other pathogens, and should not be used as the sole basis for treatment or other patient management decisions. Negative results must be combined with clinical observations, patient history, and epidemiological information. The expected result is Negative.  Fact Sheet for Patients: HairSlick.no  Fact Sheet for Healthcare Providers: quierodirigir.com  This test is not yet approved or cleared by the [place] FDA and  has been authorized for detection and/or diagnosis of SARS-CoV-2 by FDA under an Emergency Use Authorization (EUA).  This EUA will remain  in effect (meaning this test can be used) for the duration of the COVID-19 declaration under Se ction 564(b)(1) of the Act, 21 U.S.C. section 360bbb-3(b)(1), unless the authorization is terminated or revoked sooner.  Performed at [facility], [address], [place], [place] [address]          Radiology Studies: DG Chest 2 View  Result Date: 08/22/2020 CLINICAL DATA:  Suspected sepsis. EXAM: CHEST - 2 VIEW COMPARISON:  None. FINDINGS: Mild cardiomegaly with mild central vascular congestion. No focal consolidation, pleural effusion, pneumothorax. No acute osseous pathology. IMPRESSION: Mild cardiomegaly with mild central vascular congestion. No focal consolidation. Electronically Signed   By: Elgie Collard M.D.   On: 08/22/2020 23:31   CT ABDOMEN PELVIS W CONTRAST  Result Date: 08/23/2020 CLINICAL DATA:  chief complaint of right lower leg cellulitis. Patient with a history of diabetes who went to urgent care on Tuesday and started Keflex for right lower leg cellulitis. That evening he began to experience chills. The following day patient noticed blister to the inside and top of right foot. He returned to urgent care yesterday to have the blister lanced. Reports increased redness and swelling since lancing. , eval for cirrhosis EXAM: CT ABDOMEN AND PELVIS WITH CONTRAST TECHNIQUE: Multidetector CT imaging of the abdomen and pelvis was performed using the standard protocol following bolus administration of intravenous contrast. CONTRAST:  OMNIPAQUE IOHEXOL 350 MG/ML SOLN COMPARISON:  None. FINDINGS: Lower chest: Clear lung bases. Hepatobiliary: Decreased attenuation of the liver consistent with fatty infiltration. Prominent caudate lobe, with no other morphologic findings to suggest cirrhosis. No liver mass or focal lesion. Small dependent gallstones. No gallbladder wall thickening or adjacent inflammation. No bile duct dilation. Pancreas: Unremarkable. No  pancreatic ductal dilatation or surrounding inflammatory changes. Spleen: Normal in size without focal abnormality. Adrenals/Urinary Tract: Adrenal glands are unremarkable. Kidneys are normal, without renal calculi, focal lesion, or hydronephrosis. Bladder is unremarkable. Stomach/Bowel: Stomach is within normal limits. Appendix appears normal. No evidence of bowel wall thickening, distention, or inflammatory changes. Vascular/Lymphatic: No significant vascular findings are present. Prominent to mildly enlarged right inguinal and pelvic nodes, largest inguinal node measuring 1.2 cm in short axis. Enlarged right external iliac chain lymph nodes, largest 1.7 cm in short axis. 1.2 cm right common iliac artery chain lymph node. No other lymphadenopathy. Reproductive: Unremarkable. Other: Periumbilical, fat containing hernia with a wide base measuring 5.6 cm. No bowel enters this. No other hernia. No ascites. Skin and underlying trabecular thickening the inferior margin of the abdomen. Musculoskeletal: No fracture or acute finding. IMPRESSION: 1. Hepatic steatosis. Prominent caudate lobe which can be seen in the setting of cirrhosis, but there are no other CT findings to suggest cirrhosis. No liver mass. 2. Prominent to mildly enlarged right pelvic and inguinal lymph nodes, nonspecific and presumably reactive. Electronically Signed   By: Amie Portland M.D.   On: 08/23/2020 16:00   US Venous Img Lower Unilateral Right (DVT)  Result Date: 08/23/2020 CLINICAL DATA:  Right foot pain and erythema EXAM: RIGHT LOWER EXTREMITY VENOUS DOPPLER ULTRASOUND TECHNIQUE: Gray-scale sonography with compression, as well as color and duplex ultrasound, were performed to evaluate the deep venous system(s) from the level of the common femoral vein through the popliteal and proximal calf veins. COMPARISON:  None. FINDINGS: VENOUS Imaging is limited by the patient's body habitus. There is poor grayscale imaging of the venous structures of  the right lower extremity. There is preserved antegrade flow and normal augmentation involving the right common femoral, femoral, central profundus femoral, and popliteal veins. The infrapopliteal venous vasculature is not well visualized. Limited views of the contralateral common femoral vein are unremarkable. OTHER None. Limitations: Imaging is significantly limited by the patient's body habitus and depth of the none venous structures which are only appreciated with color Doppler imaging. IMPRESSION: Markedly limited examination with preserved patency of the right femoropopliteal venous vasculature. If there is  clinical suspicion of nonocclusive thrombus within the right lower extremity, CT or MR venography may be more helpful for further evaluation. Electronically Signed   By: Helyn NumbersAshesh  Parikh M.D.   On: 08/23/2020 03:11   DG Foot Complete Right  Result Date: 08/23/2020 CLINICAL DATA:  Evaluate for osteomyelitis. Abscess to the right foot. EXAM: RIGHT FOOT COMPLETE - 3+ VIEW COMPARISON:  None. FINDINGS: There is no acute fracture or dislocation. No bone erosion or periosteal elevation. Mild degenerative changes of the midfoot. There is diffuse soft tissue edema. Small pockets air in the superficial soft tissues of the medial foot may be related to recent abscess drainage. A 5 mm radiopaque focus over the plantar aspect of the midfoot may represent a wound in the skin. Clinical correlation is recommended to exclude retained foreign object. IMPRESSION: 1. No acute fracture or dislocation. No radiographic evidence of acute osteomyelitis. 2. Diffuse soft tissue edema. Small pockets of air in the medial foot, likely related to recent abscess drainage. Electronically Signed   By: Elgie CollardArash  Radparvar M.D.   On: 08/23/2020 02:41        Scheduled Meds:  carvedilol  25 mg Oral BID WC   enoxaparin (LOVENOX) injection  0.5 mg/kg Subcutaneous Q24H   insulin aspart  0-20 Units Subcutaneous TID WC   insulin aspart  0-5  Units Subcutaneous QHS   Continuous Infusions:  ceFEPime (MAXIPIME) IV 2 g (08/24/20 0911)   vancomycin 2,000 mg (08/23/20 2037)     LOS: 1 day   Time spent= 35 mins    Amyrie Illingworth Joline Maxcyhirag Audria Takeshita, MD [facility]  If 7PM-7AM, please contact night-coverage  08/24/2020, 9:46 AM

## 2020-08-25 DIAGNOSIS — E1169 Type 2 diabetes mellitus with other specified complication: Secondary | ICD-10-CM | POA: Diagnosis not present

## 2020-08-25 DIAGNOSIS — L03115 Cellulitis of right lower limb: Secondary | ICD-10-CM | POA: Diagnosis not present

## 2020-08-25 DIAGNOSIS — I159 Secondary hypertension, unspecified: Secondary | ICD-10-CM | POA: Diagnosis not present

## 2020-08-25 DIAGNOSIS — T148XXA Other injury of unspecified body region, initial encounter: Secondary | ICD-10-CM

## 2020-08-25 LAB — CBC
HCT: 31.7 % — ABNORMAL LOW (ref 39.0–52.0)
Hemoglobin: 10 g/dL — ABNORMAL LOW (ref 13.0–17.0)
MCH: 28.7 pg (ref 26.0–34.0)
MCHC: 31.5 g/dL (ref 30.0–36.0)
MCV: 91.1 fL (ref 80.0–100.0)
Platelets: 424 10*3/uL — ABNORMAL HIGH (ref 150–400)
RBC: 3.48 MIL/uL — ABNORMAL LOW (ref 4.22–5.81)
RDW: 14.3 % (ref 11.5–15.5)
WBC: 12 10*3/uL — ABNORMAL HIGH (ref 4.0–10.5)
nRBC: 0 % (ref 0.0–0.2)

## 2020-08-25 LAB — BASIC METABOLIC PANEL
Anion gap: 7 (ref 5–15)
BUN: 13 mg/dL (ref 6–20)
CO2: 26 mmol/L (ref 22–32)
Calcium: 8.8 mg/dL — ABNORMAL LOW (ref 8.9–10.3)
Chloride: 103 mmol/L (ref 98–111)
Creatinine, Ser: 0.88 mg/dL (ref 0.61–1.24)
GFR, Estimated: >60 mL/min (ref >60)
Glucose, Bld: 145 mg/dL — ABNORMAL HIGH (ref 70–99)
Potassium: 4.1 mmol/L (ref 3.5–5.1)
Sodium: 136 mmol/L (ref 135–145)

## 2020-08-25 LAB — GLUCOSE, CAPILLARY
Glucose-Capillary: 141 mg/dL — ABNORMAL HIGH (ref 70–99)
Glucose-Capillary: 184 mg/dL — ABNORMAL HIGH (ref 70–99)
Glucose-Capillary: 179 mg/dL — ABNORMAL HIGH (ref 70–99)
Glucose-Capillary: 147 mg/dL — ABNORMAL HIGH (ref 70–99)

## 2020-08-25 LAB — MAGNESIUM: Magnesium: 2.1 mg/dL (ref 1.7–2.4)

## 2020-08-25 MED ORDER — VANCOMYCIN HCL 2000 MG/400ML IV SOLN
2000.0000 mg | Freq: Three times a day (TID) | INTRAVENOUS | Status: DC
  Administered 2020-08-25 – 2020-08-26 (×3): 2000 mg via INTRAVENOUS
  Filled 2020-08-25 (×5): qty 400

## 2020-08-25 NOTE — Plan of Care (Signed)
No acute events during the night. IV Abx administered. VSS. Dressing to RLE intact. New PIV started.  Problem: Education: Goal: Knowledge of General Education information will improve Description: Including pain rating scale, medication(s)/side effects and non-pharmacologic comfort measures Outcome: Progressing   Problem: Health Behavior/Discharge Planning: Goal: Ability to manage health-related needs will improve Outcome: Progressing   Problem: Clinical Measurements: Goal: Ability to maintain clinical measurements within normal limits will improve Outcome: Progressing Goal: Will remain free from infection Outcome: Progressing Goal: Diagnostic test results will improve Outcome: Progressing Goal: Respiratory complications will improve Outcome: Progressing Goal: Cardiovascular complication will be avoided Outcome: Progressing   Problem: Activity: Goal: Risk for activity intolerance will decrease Outcome: Progressing   Problem: Nutrition: Goal: Adequate nutrition will be maintained Outcome: Progressing   Problem: Coping: Goal: Level of anxiety will decrease Outcome: Progressing   Problem: Elimination: Goal: Will not experience complications related to bowel motility Outcome: Progressing Goal: Will not experience complications related to urinary retention Outcome: Progressing   Problem: Pain Managment: Goal: General experience of comfort will improve Outcome: Progressing   Problem: Safety: Goal: Ability to remain free from injury will improve Outcome: Progressing   Problem: Skin Integrity: Goal: Risk for impaired skin integrity will decrease Outcome: Progressing   Problem: Clinical Measurements: Goal: Ability to avoid or minimize complications of infection will improve Outcome: Progressing   Problem: Skin Integrity: Goal: Skin integrity will improve Outcome: Progressing   Problem: Education: Goal: Ability to describe self-care measures that may prevent or  decrease complications (Diabetes Survival Skills Education) will improve Outcome: Progressing Goal: Individualized Educational Video(s) Outcome: Progressing   Problem: Coping: Goal: Ability to adjust to condition or change in health will improve Outcome: Progressing   Problem: Fluid Volume: Goal: Ability to maintain a balanced intake and output will improve Outcome: Progressing   Problem: Health Behavior/Discharge Planning: Goal: Ability to identify and utilize available resources and services will improve Outcome: Progressing Goal: Ability to manage health-related needs will improve Outcome: Progressing   Problem: Metabolic: Goal: Ability to maintain appropriate glucose levels will improve Outcome: Progressing   Problem: Nutritional: Goal: Maintenance of adequate nutrition will improve Outcome: Progressing Goal: Progress toward achieving an optimal weight will improve Outcome: Progressing   Problem: Skin Integrity: Goal: Risk for impaired skin integrity will decrease Outcome: Progressing   Problem: Tissue Perfusion: Goal: Adequacy of tissue perfusion will improve Outcome: Progressing

## 2020-08-25 NOTE — Progress Notes (Signed)
  Subjective:  Patient ID: [name] [name], male    DOB: November 30, 1970,  MRN: [id number]  Overall is feeling like his foot and leg is improving  Negative for chest pain and shortness of breath Constitutional signs: no Review of all other systems is negative Objective:   Vitals:   08/25/20 1544 08/25/20 2022  BP: 140/78 (!) 147/73  Pulse: 79 86  Resp: 16 17  Temp: 97.9 F (36.6 C) 97.6 F (36.4 C)  SpO2: 95% 96%   General AA&O x3. Normal mood and affect.  Vascular Dorsalis pedis and posterior tibial pulses 2/4 bilat. Brisk capillary refill to all digits.   Neurologic Epicritic sensation grossly intact.  Dermatologic Large open ulceration beginning to epithelialize, cellulitis improving  Orthopedic: MMT 5/5 in dorsiflexion, plantarflexion, inversion, and eversion. Normal joint ROM without pain or crepitus.    Assessment & Plan:  Patient was evaluated and treated and all questions answered.  Cellulitis secondary to foot ulcer and blister -Continue local wound care and he can follow-up with the wound care clinic here at [facility] -Recommend Augmentin x10 days at discharge -Would recommend him changing daily dressing at home with gauze and mupirocin ointment 2% daily -Can follow-up with [place] for regular podiatric care [address] [place], [place]  [place], [place]  Accessible via secure chat for questions or concerns.

## 2020-08-25 NOTE — Progress Notes (Signed)
Physical Therapy Treatment Patient Details Name: [name] [name] MRN: [id number] DOB: 30-Nov-1970 Today's Date: 08/25/2020    History of Present Illness Pt is a 50 y/o M admitted on 08/23/20 from urgent care for worsening RLE cellulitis who failed outpatient keflex. X-ray was negative for osteomyelitis, chest x-ray showed mild central vascular congestion. Pt is s/p R foot debridement. PMH: DM2, HTN    PT Comments    Pt seen for PT tx with pt agreeable to participation. Pt is able to complete bed mobility & transfers with mod I. Pt ambulates around room & into bathroom without AD & independently without overt LOB. Provided pt with bariatric RW but pt declines trial with AD. Pt also voices he doesn't need to practice stairs prior to d/c home. At this time, pt does not require further acute PT needs. PT to sign off at this time, please re-consult if new needs arise.     Follow Up Recommendations  No PT follow up;Supervision - Intermittent     Equipment Recommendations  Rolling walker with 5" wheels (bariatric RW)    Recommendations for Other Services       Precautions / Restrictions Precautions Precautions: None Restrictions Weight Bearing Restrictions: Yes RLE Weight Bearing: Weight bearing as tolerated    Mobility  Bed Mobility Overal bed mobility: Modified Independent             General bed mobility comments: extra time to complete supine>sit with HOB elevated, use of bed rails    Transfers Overall transfer level: Modified independent Equipment used: None             General transfer comment: sit<>stand from EOB with use of 1 bed rail  Ambulation/Gait Ambulation/Gait assistance: Modified independent (Device/Increase time) Gait Distance (Feet): 40 Feet Assistive device: None Gait Pattern/deviations: Wide base of support;Decreased weight shift to right;Decreased step length - left;Decreased step length - right;Decreased stride length Gait velocity: decreased        Stairs             Wheelchair Mobility    Modified Rankin (Stroke Patients Only)       Balance Overall balance assessment: Needs assistance Sitting-balance support: Feet supported;No upper extremity supported       Standing balance support: No upper extremity supported;During functional activity Standing balance-Leahy Scale: Good                              Cognition Arousal/Alertness: Awake/alert Behavior During Therapy: WFL for tasks assessed/performed Overall Cognitive Status: Within Functional Limits for tasks assessed                                 General Comments: Pt irritable as he has requested for IV to be disconnected - notified nurse who disconnected it at beginning of session.      Exercises      General Comments General comments (skin integrity, edema, etc.): Pt with continent void during session.      Pertinent Vitals/Pain Pain Assessment: Faces Faces Pain Scale: Hurts a little bit Pain Location: generalized soreness Pain Descriptors / Indicators: Sore Pain Intervention(s): Monitored during session    Home Living                      Prior Function            PT Goals (current goals can  now be found in the care plan section) Acute Rehab PT Goals Patient Stated Goal: go home, foot to heal PT Goal Formulation: With patient/family Time For Goal Achievement: 09/07/20 Potential to Achieve Goals: Good Progress towards PT goals: Progressing toward goals    Frequency           PT Plan Other (comment);Frequency needs to be updated (no further PT needs at this time)    Co-evaluation              AM-PAC PT "6 Clicks" Mobility   Outcome Measure  Help needed turning from your back to your side while in a flat bed without using bedrails?: None Help needed moving from lying on your back to sitting on the side of a flat bed without using bedrails?: None Help needed moving to and from a  bed to a chair (including a wheelchair)?: None Help needed standing up from a chair using your arms (e.g., wheelchair or bedside chair)?: None Help needed to walk in hospital room?: None Help needed climbing 3-5 steps with a railing? : A Little 6 Click Score: 23    End of Session   Activity Tolerance: Patient tolerated treatment well Patient left: in chair;with call bell/phone within reach         Time: 1243-1259 PT Time Calculation (min) (ACUTE ONLY): 16 min  Charges:  $Therapeutic Activity: 8-22 mins                     Aleda Grana, PT, DPT 08/25/20, 1:06 PM    Sandi Mariscal 08/25/2020, 1:05 PM

## 2020-08-25 NOTE — Consult Note (Signed)
Pharmacy Antibiotic Note  [name] [name] is a 50 y.o. male admitted on 08/23/2020 with cellulitis.  Pharmacy has been consulted for vancomycin & cefepime dosing. Multiple organisms found in patient's foot/wound culture, none predominant currently.  Antibiotics Day 3  Plan: Vancomycin 2000 mg IV Q 8 hrs. Goal AUC 400-550. Expected AUC: 455.1 Expected Css: 15.1 SCr used: 0.88  Cefepime 2g IV Q 8 hours    Height: 6\' 2"  (188 cm) Weight: (!) 263.1 kg (580 lb) IBW/kg (Calculated) : 82.2  Temp (24hrs), Avg:97.7 F (36.5 C), Min:97.3 F (36.3 C), Max:98.2 F (36.8 C)  Recent Labs  Lab 08/22/20 2310 08/23/20 0236 08/23/20 0655 08/25/20 0522  WBC 14.2*  --  14.6* 12.0*  CREATININE 1.13  --  1.05 0.88  LATICACIDVEN 1.6 1.5  --   --      Estimated Creatinine Clearance: 217.2 mL/min (by C-G formula based on SCr of 0.88 mg/dL).    No Known Allergies  Antimicrobials this admission: vancomycin (8/20 >>  Cefepime (8/20 >>   Dose adjustments this admission: N/A  Microbiology results: 8/19 BCx: Multiple organisms present, none predominant 8/19 MRSA PCR: negative  Thank you for allowing pharmacy to be a part of this patient's care.  9/19, PharmD Clinical Pharmacist 08/25/2020 2:17 PM

## 2020-08-25 NOTE — Progress Notes (Signed)
PROGRESS NOTE    [name] [name]  ZOX:[id number] DOB: [birth year] DOA: 08/23/2020 PCP: Lauro Regulus, MD   Brief Narrative:  50 year old with history of DM2, HTN admitted from urgent care for worsening right lower extremity cellulitis who failed outpatient Keflex.  He was diagnosed with worsening cellulitis, x-ray was negative for osteomyelitis.  Venous Doppler was limited for exam.  Chest x-ray showed mild central vascular congestion.  Started on IV vancomycin and admitted to the hospital.  Podiatry team has been following and, performed bedside.   Assessment & Plan:   Active Problems:   Cellulitis of right lower leg   Hypertension   Diabetes mellitus, type II (HCC)   Morbid obesity with BMI of 60.0-69.9, adult (HCC)   Cellulitis of right lower leg with blister right foot status post debridement by podiatry -Status postdebridement.  Podiatry following - MRI-unable to perform given his weight -ABI-currently pending - Antibiotics-vancomycin, cefepime - Follow-up culture data  Bilateral lower extremity swelling - Echocardiogram-EF 65% - BNP normal but he is quite obese therefore could be unreliable - CT abdomen pelvis shows hepatic steatosis     Hypertension - Coreg 25 mg twice daily.  IV hydralazine as needed/Lopressor ordered     Diabetes mellitus, type II (HCC), blood glucose in acceptable range - Sliding scale Accu-Chek - A1c- p 7.4     Morbid obesity with BMI of 60.0-69.9, adult (HCC) - Complicating factor to overall prognosis and care   Trial of CPAP at bedtime in the hospital   DVT prophylaxis: Lovenox Code Status: Full Family Communication: None at bedside  Status is: Inpatient  Remains inpatient appropriate because:IV treatments appropriate due to intensity of illness or inability to take PO  Dispo: The patient is from: Home              Anticipated d/c is to: Home              Patient currently is not medically stable to d/c.  Currently ABIs  pending, podiatry following.   Difficult to place patient No        Subjective: Patient states his right lower extremity pain is much better today and is able to tolerate/bear more weight on it.  He remains afebrile overnight. Did not get a chance to use CPAP last night  Review of Systems Otherwise negative except as per HPI, including: General: Denies fever, chills, night sweats or unintended weight loss. Resp: Denies cough, wheezing, shortness of breath. Cardiac: Denies chest pain, palpitations, orthopnea, paroxysmal nocturnal dyspnea. GI: Denies abdominal pain, nausea, vomiting, diarrhea or constipation GU: Denies dysuria, frequency, hesitancy or incontinence MS: Denies muscle aches, joint pain or swelling Neuro: Denies headache, neurologic deficits (focal weakness, numbness, tingling), abnormal gait Psych: Denies anxiety, depression, SI/HI/AVH Skin: Denies new rashes or lesions ID: Denies sick contacts, exotic exposures, travel  Examination: Constitutional: Not in acute distress Respiratory: Clear to auscultation bilaterally Cardiovascular: Normal sinus rhythm, no rubs Abdomen: Nontender nondistended good bowel sounds Musculoskeletal: No edema noted Skin: Right lower extremity dressing in place Neurologic: CN 2-12 grossly intact.  And nonfocal Psychiatric: Normal judgment and insight. Alert and oriented x 3. Normal mood.  Pic from 8/20:       Objective: Vitals:   08/24/20 1110 08/24/20 1557 08/24/20 2022 08/25/20 0440  BP: 140/87 132/74 139/67 (!) 155/79  Pulse: 85 81 90 88  Resp: Temp: 98.4 F (36.9 C) (!) 97.3 F (36.3 C) 97.7 F (36.5 C) 98.2 F (36.8 C)  TempSrc:      SpO2: 93% 99% 94% 96%  Weight:      Height:        Intake/Output Summary (Last 24 hours) at 08/25/2020 0746 Last data filed at 08/24/2020 1415 Gross per 24 hour  Intake 120 ml  Output --  Net 120 ml   Filed Weights   08/22/20 2305 08/23/20 2004  Weight: (!) 240.4 kg  (!) 263.1 kg     Data Reviewed:   CBC: Recent Labs  Lab 08/22/20 2310 08/23/20 0655 08/25/20 0522  WBC 14.2* 14.6* 12.0*  NEUTROABS 10.5*  --   --   HGB 12.1* 10.6* 10.0*  HCT 37.4* 33.2* 31.7*  MCV 91.2 89.7 91.1  PLT 434* 393 424*   Basic Metabolic Panel: Recent Labs  Lab 08/22/20 2310 08/23/20 0655 08/25/20 0522  NA 134*  --  136  K 3.7  --  4.1  CL 97*  --  103  CO2 24  --  26  GLUCOSE 148*  --  145*  BUN 15  --  13  CREATININE 1.13 1.05 0.88  CALCIUM 9.1  --  8.8*  MG  --   --  2.1   GFR: Estimated Creatinine Clearance: 217.2 mL/min (by C-G formula based on SCr of 0.88 mg/dL). Liver Function Tests: Recent Labs  Lab 08/22/20 2310  AST 22  ALT 21  ALKPHOS 94  BILITOT 0.5  PROT 8.8*  ALBUMIN 3.2*   No results for input(s): LIPASE, AMYLASE in the last 168 hours. No results for input(s): AMMONIA in the last 168 hours. Coagulation Profile: Recent Labs  Lab 08/22/20 2310  INR 1.1   Cardiac Enzymes: No results for input(s): CKTOTAL, CKMB, CKMBINDEX, TROPONINI in the last 168 hours. BNP (last 3 results) No results for input(s): PROBNP in the last 8760 hours. HbA1C: Recent Labs    08/23/20 0655  HGBA1C 7.4*   CBG: Recent Labs  Lab 08/23/20 2108 08/24/20 0837 08/24/20 1158 08/24/20 1637 08/24/20 2146  GLUCAP 151* 131* 173* 151* 159*   Lipid Profile: No results for input(s): CHOL, HDL, LDLCALC, TRIG, CHOLHDL, LDLDIRECT in the last 72 hours. Thyroid Function Tests: No results for input(s): TSH, T4TOTAL, FREET4, T3FREE, THYROIDAB in the last 72 hours. Anemia Panel: No results for input(s): VITAMINB12, FOLATE, FERRITIN, TIBC, IRON, RETICCTPCT in the last 72 hours. Sepsis Labs: Recent Labs  Lab 08/22/20 2310 08/23/20 0236  PROCALCITON 0.27  --   LATICACIDVEN 1.6 1.5    Recent Results (from the past 240 hour(s))  Culture, blood (Routine x 2)     Status: None (Preliminary result)   Collection Time: 08/22/20 11:10 PM   Specimen: BLOOD   Result Value Ref Range Status   Specimen Description BLOOD BLOOD LEFT HAND  Final   Special Requests   Final    BOTTLES DRAWN AEROBIC AND ANAEROBIC Blood Culture results may not be optimal due to an excessive volume of blood received in culture bottles   Culture   Final    NO GROWTH 3 DAYS Performed at [facility], [address], [place], [place] [address]    Report Status PENDING  Incomplete  Aerobic/Anaerobic Culture w Gram Stain (surgical/deep wound)     Status: Abnormal (Preliminary result)   Collection Time: 08/23/20  2:36 AM   Specimen: Foot  Result Value Ref Range Status   Specimen Description   Final    FOOT Performed at [facility], [address] [place], [place] [address]  Special Requests   Final    NONE Performed at [facility], [address] [place], [place] [address]    Gram Stain   Final    RARE WBC PRESENT,BOTH PMN AND MONONUCLEAR RARE GRAM POSITIVE COCCI IN PAIRS Performed at [facility], [address], [place], [place] [address]    Culture MULTIPLE ORGANISMS PRESENT, NONE PREDOMINANT (A)  Final   Report Status PENDING  Incomplete  Culture, blood (Routine X 2) w Reflex to ID Panel     Status: None (Preliminary result)   Collection Time: 08/23/20  2:36 AM   Specimen: BLOOD  Result Value Ref Range Status   Specimen Description BLOOD RIGHT ASSIST CONTROL  Final   Special Requests   Final    BOTTLES DRAWN AEROBIC AND ANAEROBIC Blood Culture adequate volume   Culture   Final    NO GROWTH 2 DAYS Performed at [facility], [address] [place], [place] [address]    Report Status PENDING  Incomplete  MRSA Next Gen by PCR, Nasal     Status: None   Collection Time: 08/23/20  8:24 AM   Specimen: Nasopharyngeal Swab; Nasal Swab  Result Value Ref Range Status   MRSA by PCR Next Gen NOT DETECTED NOT DETECTED Final    Comment: (NOTE) The GeneXpert MRSA Assay (FDA approved for NASAL specimens only), is one  component of a comprehensive MRSA colonization surveillance program. It is not intended to diagnose MRSA infection nor to guide or monitor treatment for MRSA infections. Test performance is not FDA approved in patients less than 61 years old. Performed at [facility], [address] [place], [place] [address]   SARS CORONAVIRUS 2 (TAT 6-24 HRS) Nasopharyngeal Nasopharyngeal Swab     Status: None   Collection Time: 08/23/20  8:24 AM   Specimen: Nasopharyngeal Swab  Result Value Ref Range Status   SARS Coronavirus 2 NEGATIVE NEGATIVE Final    Comment: (NOTE) SARS-CoV-2 target nucleic acids are NOT DETECTED.  The SARS-CoV-2 RNA is generally detectable in upper and lower respiratory specimens during the acute phase of infection. Negative results do not preclude SARS-CoV-2 infection, do not rule out co-infections with other pathogens, and should not be used as the sole basis for treatment or other patient management decisions. Negative results must be combined with clinical observations, patient history, and epidemiological information. The expected result is Negative.  Fact Sheet for Patients: HairSlick.no  Fact Sheet for Healthcare Providers: quierodirigir.com  This test is not yet approved or cleared by the [place] FDA and  has been authorized for detection and/or diagnosis of SARS-CoV-2 by FDA under an Emergency Use Authorization (EUA). This EUA will remain  in effect (meaning this test can be used) for the duration of the COVID-19 declaration under Se ction 564(b)(1) of the Act, 21 U.S.C. section 360bbb-3(b)(1), unless the authorization is terminated or revoked sooner.  Performed at [facility], [address], [place], [place] [address]          Radiology Studies: CT ABDOMEN PELVIS W CONTRAST  Result Date: 08/23/2020 CLINICAL DATA:  chief complaint of right lower leg cellulitis. Patient  with a history of diabetes who went to urgent care on Tuesday and started Keflex for right lower leg cellulitis. That evening he began to experience chills. The following day patient noticed blister to the inside and top of right foot. He returned to urgent care yesterday to have the blister lanced. Reports increased redness and  swelling since lancing. , eval for cirrhosis EXAM: CT ABDOMEN AND PELVIS WITH CONTRAST TECHNIQUE: Multidetector CT imaging of the abdomen and pelvis was performed using the standard protocol following bolus administration of intravenous contrast. CONTRAST:  OMNIPAQUE IOHEXOL 350 MG/ML SOLN COMPARISON:  None. FINDINGS: Lower chest: Clear lung bases. Hepatobiliary: Decreased attenuation of the liver consistent with fatty infiltration. Prominent caudate lobe, with no other morphologic findings to suggest cirrhosis. No liver mass or focal lesion. Small dependent gallstones. No gallbladder wall thickening or adjacent inflammation. No bile duct dilation. Pancreas: Unremarkable. No pancreatic ductal dilatation or surrounding inflammatory changes. Spleen: Normal in size without focal abnormality. Adrenals/Urinary Tract: Adrenal glands are unremarkable. Kidneys are normal, without renal calculi, focal lesion, or hydronephrosis. Bladder is unremarkable. Stomach/Bowel: Stomach is within normal limits. Appendix appears normal. No evidence of bowel wall thickening, distention, or inflammatory changes. Vascular/Lymphatic: No significant vascular findings are present. Prominent to mildly enlarged right inguinal and pelvic nodes, largest inguinal node measuring 1.2 cm in short axis. Enlarged right external iliac chain lymph nodes, largest 1.7 cm in short axis. 1.2 cm right common iliac artery chain lymph node. No other lymphadenopathy. Reproductive: Unremarkable. Other: Periumbilical, fat containing hernia with a wide base measuring 5.6 cm. No bowel enters this. No other hernia. No ascites. Skin and  underlying trabecular thickening the inferior margin of the abdomen. Musculoskeletal: No fracture or acute finding. IMPRESSION: 1. Hepatic steatosis. Prominent caudate lobe which can be seen in the setting of cirrhosis, but there are no other CT findings to suggest cirrhosis. No liver mass. 2. Prominent to mildly enlarged right pelvic and inguinal lymph nodes, nonspecific and presumably reactive. Electronically Signed   By: Amie Portland M.D.   On: 08/23/2020 16:00   ECHOCARDIOGRAM COMPLETE  Result Date: 08/24/2020    ECHOCARDIOGRAM REPORT   Patient Name:   KIEFFER BLATZ Date of Exam: 08/24/2020 Medical Rec #:  [id number]       Height:       74.0 in Accession #:    [id number]      Weight:       580.0 lb Date of Birth:  1970-05-09       BSA:          3.417 m Patient Age:    50 years        BP:           135/93 mmHg Patient Gender: M               HR:           87 bpm. Exam Location:  [facility] Procedure: 2D Echo and Intracardiac Opacification Agent Indications:     Dyspnea R06.00  History:         Patient has no prior history of Echocardiogram examinations.  Sonographer:     Overton Mam RDCS Referring Phys:  [id number] Gillis Santa Diagnosing Phys: Julien Nordmann MD  Sonographer Comments: Technically challenging study due to limited acoustic windows, no subcostal window, suboptimal parasternal window, suboptimal apical window and patient is morbidly obese. IMPRESSIONS  1. Left ventricular ejection fraction, by estimation, is 60 to 65%. The left ventricle has normal function. The left ventricle has no regional wall motion abnormalities. Left ventricular diastolic parameters are indeterminate.  2. Right ventricular systolic function is normal. The right ventricular size is normal.  3. The mitral valve was not well visualized. No evidence of mitral valve regurgitation. No evidence of mitral stenosis.  4. Challenging images secondary to body habitus, definity  used. FINDINGS  Left Ventricle: Left ventricular ejection  fraction, by estimation, is 60 to 65%. The left ventricle has normal function. The left ventricle has no regional wall motion abnormalities. Definity contrast agent was given IV to delineate the left ventricular  endocardial borders. The left ventricular internal cavity size was normal in size. There is no left ventricular hypertrophy. Left ventricular diastolic parameters are indeterminate. Right Ventricle: The right ventricular size is normal. No increase in right ventricular wall thickness. Right ventricular systolic function is normal. Left Atrium: Left atrial size was not well visualized. Right Atrium: Right atrial size was not well visualized. Pericardium: The pericardium was not well visualized. Mitral Valve: The mitral valve was not well visualized. No evidence of mitral valve regurgitation. No evidence of mitral valve stenosis. Tricuspid Valve: The tricuspid valve is not well visualized. Tricuspid valve regurgitation is not demonstrated. No evidence of tricuspid stenosis. Aortic Valve: The aortic valve was not well visualized. Aortic valve regurgitation is not visualized. No aortic stenosis is present. Pulmonic Valve: The pulmonic valve was not well visualized. Pulmonic valve regurgitation is not visualized. No evidence of pulmonic stenosis. Aorta: The aortic root is normal in size and structure and the aortic root was not well visualized. Venous: The pulmonary veins were not well visualized. The inferior vena cava was not well visualized. The inferior vena cava is normal in size with greater than 50% respiratory variability, suggesting right atrial pressure of 3 mmHg. IAS/Shunts: No atrial level shunt detected by color flow Doppler.  LEFT VENTRICLE PLAX 2D LVIDd:         4.83 cm  Diastology LVIDs:         3.17 cm  LV e' lateral: 12.60 cm/s LV PW:         1.33 cm LV IVS:        1.44 cm LVOT diam:     2.10 cm LVOT Area:     3.46 cm  LEFT ATRIUM         Index LA diam:    3.40 cm 0.99 cm/m                         PULMONIC VALVE AORTA                 PV Vmax:       0.87 m/s Ao Root diam: 2.70 cm PV Peak grad:  3.0 mmHg   SHUNTS Systemic Diam: 2.10 cm Julien Nordmannimothy Gollan MD Electronically signed by Julien Nordmannimothy Gollan MD Signature Date/Time: 08/24/2020/1:48:14 PM    Final         Scheduled Meds:  carvedilol  25 mg Oral BID WC   enoxaparin (LOVENOX) injection  0.5 mg/kg Subcutaneous Q24H   insulin aspart  0-20 Units Subcutaneous TID WC   insulin aspart  0-5 Units Subcutaneous QHS   Continuous Infusions:  ceFEPime (MAXIPIME) IV 2 g (08/25/20 0022)   vancomycin 2,000 mg (08/25/20 0102)     LOS: 2 days   Time spent= 35 mins    Nakeya Adinolfi Joline Maxcyhirag Pat Elicker, MD [facility]  If 7PM-7AM, please contact night-coverage  08/25/2020, 7:46 AM

## 2020-08-25 NOTE — TOC Progression Note (Signed)
Transition of Care [facility]) - Progression Note    Patient Details  Name: [name] [name] MRN: [id number] Date of Birth: March 07, 1970  [facility]) CM/SW [place], RN Phone Number: 08/25/2020, 2:58 PM  Clinical Narrative:     Met with the patient and his niece in the room to discuss DC plan and needs He has a foot ulcer He denies the need for [facility] services stated that wife and niece will do wound care He would like a bariatric walker, he does still drive and says he has no restrictions, he is independent at home, he can afford his medications       Expected Discharge Plan and Services                                                 Social Determinants of Health (SDOH) Interventions    Readmission Risk Interventions No flowsheet data found.

## 2020-08-26 ENCOUNTER — Inpatient Hospital Stay: Payer: BC Managed Care – PPO

## 2020-08-26 DIAGNOSIS — Z6841 Body Mass Index (BMI) 40.0 and over, adult: Secondary | ICD-10-CM | POA: Diagnosis not present

## 2020-08-26 DIAGNOSIS — L03115 Cellulitis of right lower limb: Secondary | ICD-10-CM | POA: Diagnosis not present

## 2020-08-26 DIAGNOSIS — E1169 Type 2 diabetes mellitus with other specified complication: Secondary | ICD-10-CM | POA: Diagnosis not present

## 2020-08-26 LAB — CBC
HCT: 32.2 % — ABNORMAL LOW (ref 39.0–52.0)
Hemoglobin: 10.1 g/dL — ABNORMAL LOW (ref 13.0–17.0)
MCH: 28.8 pg (ref 26.0–34.0)
MCHC: 31.4 g/dL (ref 30.0–36.0)
MCV: 91.7 fL (ref 80.0–100.0)
Platelets: 443 10*3/uL — ABNORMAL HIGH (ref 150–400)
RBC: 3.51 MIL/uL — ABNORMAL LOW (ref 4.22–5.81)
RDW: 14 % (ref 11.5–15.5)
WBC: 12.8 10*3/uL — ABNORMAL HIGH (ref 4.0–10.5)
nRBC: 0 % (ref 0.0–0.2)

## 2020-08-26 LAB — BASIC METABOLIC PANEL
Anion gap: 10 (ref 5–15)
BUN: 15 mg/dL (ref 6–20)
CO2: 25 mmol/L (ref 22–32)
Calcium: 8.8 mg/dL — ABNORMAL LOW (ref 8.9–10.3)
Chloride: 101 mmol/L (ref 98–111)
Creatinine, Ser: 0.89 mg/dL (ref 0.61–1.24)
GFR, Estimated: >60 mL/min (ref >60)
Glucose, Bld: 146 mg/dL — ABNORMAL HIGH (ref 70–99)
Potassium: 4.1 mmol/L (ref 3.5–5.1)
Sodium: 136 mmol/L (ref 135–145)

## 2020-08-26 LAB — GLUCOSE, CAPILLARY
Glucose-Capillary: 147 mg/dL — ABNORMAL HIGH (ref 70–99)
Glucose-Capillary: 168 mg/dL — ABNORMAL HIGH (ref 70–99)

## 2020-08-26 LAB — MAGNESIUM: Magnesium: 1.9 mg/dL (ref 1.7–2.4)

## 2020-08-26 MED ORDER — AMOXICILLIN-POT CLAVULANATE 875-125 MG PO TABS: 1.0000 | ORAL_TABLET | Freq: Two times a day (BID) | ORAL | 0 refills | Status: DC

## 2020-08-26 MED ORDER — SENNOSIDES-DOCUSATE SODIUM 8.6-50 MG PO TABS: 2.0000 | ORAL_TABLET | Freq: Every evening | ORAL | 0 refills | Status: AC | PRN

## 2020-08-26 MED ORDER — HYDROCODONE-ACETAMINOPHEN 5-325 MG PO TABS: 1.0000 | ORAL_TABLET | ORAL | 0 refills | Status: DC | PRN

## 2020-08-26 NOTE — Discharge Instructions (Addendum)
-  Would recommend him changing daily dressing at home with gauze and mupirocin ointment 2% daily -regular podiatric care [address], [place], [place]

## 2020-08-26 NOTE — Discharge Summary (Addendum)
Physician Discharge Summary  [name] [name] ZDG:[id number] DOB: 30-Mar-1970 DOA: 08/23/2020  PCP: Lauro Regulus, MD  Admit date: 08/23/2020 Discharge date: 08/26/2020  Admitted From: Home Disposition: Home  Recommendations for Outpatient Follow-up:  Follow up with PCP in 1-2 weeks Please obtain BMP/CBC in one week your next doctors visit.  Oral Augmentin twice daily for 10 more days prescribed Follow-up outpatient podiatry, instructions given Daily dressing changes with gauze and mupirocin ointment 2% daily.  Wife is able to perform this at home. He would benefit from outpatient sleep study   Discharge Condition: Stable CODE STATUS: Full code Diet recommendation: Diabetic  Brief/Interim Summary: 50 year old with history of DM2, HTN admitted from urgent care for worsening right lower extremity cellulitis who failed outpatient Keflex.  He was diagnosed with worsening cellulitis, x-ray was negative for osteomyelitis.  Venous Doppler was limited for exam.  Chest x-ray showed mild central vascular congestion.  Started on IV vancomycin and admitted to the hospital.  Podiatry team has been following and, performed bedside.     Assessment & Plan:   Active Problems:   Cellulitis of right lower leg   Hypertension   Diabetes mellitus, type II (HCC)   Morbid obesity with BMI of 60.0-69.9, adult (HCC)     Cellulitis of right lower leg with blister right foot status post debridement by podiatry -Status postdebridement.  Seen by podiatry in the hospital.  We will follow-up outpatient podiatry - MRI-unable to perform given his weight -Will be discharged on oral antibiotic Augmentin for 10 more days.  Instructions for dressing changes as mentioned above.   Bilateral lower extremity swelling - Echocardiogram-EF 65% - BNP normal but he is quite obese therefore could be unreliable - CT abdomen pelvis shows hepatic steatosis     Hypertension - Coreg 25 mg twice daily.  IV hydralazine as  needed/Lopressor ordered     Diabetes mellitus, type II (HCC), blood glucose in acceptable range - Sliding scale Accu-Chek - A1c- p 7.4.  Resume Metaglip     Morbid obesity with BMI of 60.0-69.9, adult (HCC) - Complicating factor to overall prognosis and care  Patient would benefit from outpatient sleep study  Body mass index is 74.47 kg/m.         Discharge Diagnoses:  Active Problems:   Cellulitis of right lower extremity   Hypertension   Diabetes mellitus, type II (HCC)   Morbid obesity with BMI of 60.0-69.9, adult (HCC)   Blister      Consultations: Podaitry  Subjective: Doing ok, sitting up in te chair. Wants to go home.   Discharge Exam: Vitals:   08/26/20 0346 08/26/20 0800  BP: (!) 151/81 (!) 155/85  Pulse: 84 83  Resp: 16 15  Temp: 98.2 F (36.8 C) 97.9 F (36.6 C)  SpO2: 98% 96%   Vitals:   08/25/20 1544 08/25/20 2022 08/26/20 0346 08/26/20 0800  BP: 140/78 (!) 147/73 (!) 151/81 (!) 155/85  Pulse: 79 86 84 83  Resp: Temp: 97.9 F (36.6 C) 97.6 F (36.4 C) 98.2 F (36.8 C) 97.9 F (36.6 C)  TempSrc:      SpO2: 95% 96% 98% 96%  Weight:      Height:        General: Pt is alert, awake, not in acute distress Cardiovascular: RRR, S1/S2 +, no rubs, no gallops Respiratory: CTA bilaterally, no wheezing, no rhonchi Abdominal: Soft, NT, ND, bowel sounds + Extremities: no edema, no cyanosis Right lower extremity dressing is  in place  Discharge Instructions   Allergies as of 08/26/2020   No Known Allergies      Medication List     STOP taking these medications    cephALEXin 500 MG capsule Commonly known as: KEFLEX       TAKE these medications    amoxicillin-clavulanate 875-125 MG tablet Commonly known as: Augmentin Take 1 tablet by mouth every 12 (twelve) hours for 10 days.   carvedilol 25 MG tablet Commonly known as: COREG Take 25 mg by mouth in the morning and at bedtime.   glipiZIDE-metformin 5-500 MG  tablet Commonly known as: METAGLIP Take 1 tablet by mouth in the morning and at bedtime.   HYDROcodone-acetaminophen 5-325 MG tablet Commonly known as: NORCO/VICODIN Take 1-2 tablets by mouth every 4 (four) hours as needed for moderate pain.   senna-docusate 8.6-50 MG tablet Commonly known as: Senokot-S Take 2 tablets by mouth at bedtime as needed for mild constipation.               Durable Medical Equipment  (From admission, onward)           Start     Ordered   08/25/20 1503  For home use only DME Walker rolling  Once       Comments: Heavy Duty Bariatric  Question Answer Comment  Walker: With 5 Inch Wheels   Patient needs a walker to treat with the following condition Weakness      08/25/20 1502            Follow-up Information     Lauro Regulus, MD Follow up in 1 week(s).   Specialty: Internal Medicine Why: at 10am Contact information: [address] [facility] [place] [address] [phone]                No Known Allergies  You were cared for by a hospitalist during your hospital stay. If you have any questions about your discharge medications or the care you received while you were in the hospital after you are discharged, you can call the unit and asked to speak with the hospitalist on call if the hospitalist that took care of you is not available. Once you are discharged, your primary care physician will handle any further medical issues. Please note that no refills for any discharge medications will be authorized once you are discharged, as it is imperative that you return to your primary care physician (or establish a relationship with a primary care physician if you do not have one) for your aftercare needs so that they can reassess your need for medications and monitor your lab values.   Procedures/Studies: DG Chest 2 View  Result Date: 08/22/2020 CLINICAL DATA:  Suspected sepsis. EXAM: CHEST - 2 VIEW  COMPARISON:  None. FINDINGS: Mild cardiomegaly with mild central vascular congestion. No focal consolidation, pleural effusion, pneumothorax. No acute osseous pathology. IMPRESSION: Mild cardiomegaly with mild central vascular congestion. No focal consolidation. Electronically Signed   By: Elgie Collard M.D.   On: 08/22/2020 23:31   CT ABDOMEN PELVIS W CONTRAST  Result Date: 08/23/2020 CLINICAL DATA:  chief complaint of right lower leg cellulitis. Patient with a history of diabetes who went to urgent care on Tuesday and started Keflex for right lower leg cellulitis. That evening he began to experience chills. The following day patient noticed blister to the inside and top of right foot. He returned to urgent care yesterday to have the blister lanced. Reports  increased redness and swelling since lancing. , eval for cirrhosis EXAM: CT ABDOMEN AND PELVIS WITH CONTRAST TECHNIQUE: Multidetector CT imaging of the abdomen and pelvis was performed using the standard protocol following bolus administration of intravenous contrast. CONTRAST:  OMNIPAQUE IOHEXOL 350 MG/ML SOLN COMPARISON:  None. FINDINGS: Lower chest: Clear lung bases. Hepatobiliary: Decreased attenuation of the liver consistent with fatty infiltration. Prominent caudate lobe, with no other morphologic findings to suggest cirrhosis. No liver mass or focal lesion. Small dependent gallstones. No gallbladder wall thickening or adjacent inflammation. No bile duct dilation. Pancreas: Unremarkable. No pancreatic ductal dilatation or surrounding inflammatory changes. Spleen: Normal in size without focal abnormality. Adrenals/Urinary Tract: Adrenal glands are unremarkable. Kidneys are normal, without renal calculi, focal lesion, or hydronephrosis. Bladder is unremarkable. Stomach/Bowel: Stomach is within normal limits. Appendix appears normal. No evidence of bowel wall thickening, distention, or inflammatory changes. Vascular/Lymphatic: No significant  vascular findings are present. Prominent to mildly enlarged right inguinal and pelvic nodes, largest inguinal node measuring 1.2 cm in short axis. Enlarged right external iliac chain lymph nodes, largest 1.7 cm in short axis. 1.2 cm right common iliac artery chain lymph node. No other lymphadenopathy. Reproductive: Unremarkable. Other: Periumbilical, fat containing hernia with a wide base measuring 5.6 cm. No bowel enters this. No other hernia. No ascites. Skin and underlying trabecular thickening the inferior margin of the abdomen. Musculoskeletal: No fracture or acute finding. IMPRESSION: 1. Hepatic steatosis. Prominent caudate lobe which can be seen in the setting of cirrhosis, but there are no other CT findings to suggest cirrhosis. No liver mass. 2. Prominent to mildly enlarged right pelvic and inguinal lymph nodes, nonspecific and presumably reactive. Electronically Signed   By: Amie Portland M.D.   On: 08/23/2020 16:00   US Venous Img Lower Unilateral Right (DVT)  Result Date: 08/23/2020 CLINICAL DATA:  Right foot pain and erythema EXAM: RIGHT LOWER EXTREMITY VENOUS DOPPLER ULTRASOUND TECHNIQUE: Gray-scale sonography with compression, as well as color and duplex ultrasound, were performed to evaluate the deep venous system(s) from the level of the common femoral vein through the popliteal and proximal calf veins. COMPARISON:  None. FINDINGS: VENOUS Imaging is limited by the patient's body habitus. There is poor grayscale imaging of the venous structures of the right lower extremity. There is preserved antegrade flow and normal augmentation involving the right common femoral, femoral, central profundus femoral, and popliteal veins. The infrapopliteal venous vasculature is not well visualized. Limited views of the contralateral common femoral vein are unremarkable. OTHER None. Limitations: Imaging is significantly limited by the patient's body habitus and depth of the none venous structures which are only  appreciated with color Doppler imaging. IMPRESSION: Markedly limited examination with preserved patency of the right femoropopliteal venous vasculature. If there is clinical suspicion of nonocclusive thrombus within the right lower extremity, CT or MR venography may be more helpful for further evaluation. Electronically Signed   By: Helyn Numbers M.D.   On: 08/23/2020 03:11   DG Foot Complete Right  Result Date: 08/23/2020 CLINICAL DATA:  Evaluate for osteomyelitis. Abscess to the right foot. EXAM: RIGHT FOOT COMPLETE - 3+ VIEW COMPARISON:  None. FINDINGS: There is no acute fracture or dislocation. No bone erosion or periosteal elevation. Mild degenerative changes of the midfoot. There is diffuse soft tissue edema. Small pockets air in the superficial soft tissues of the medial foot may be related to recent abscess drainage. A 5 mm radiopaque focus over the plantar aspect of the midfoot may represent a wound in  the skin. Clinical correlation is recommended to exclude retained foreign object. IMPRESSION: 1. No acute fracture or dislocation. No radiographic evidence of acute osteomyelitis. 2. Diffuse soft tissue edema. Small pockets of air in the medial foot, likely related to recent abscess drainage. Electronically Signed   By: Elgie Collard M.D.   On: 08/23/2020 02:41   ECHOCARDIOGRAM COMPLETE  Result Date: 08/24/2020    ECHOCARDIOGRAM REPORT   Patient Name:   [name] [name] Date of Exam: 08/24/2020 Medical Rec #:  [id number]       Height:       74.0 in Accession #:    [id number]      Weight:       580.0 lb Date of Birth:  [birth year]       BSA:          3.417 m Patient Age:    50 years        BP:           135/93 mmHg Patient Gender: M               HR:           87 bpm. Exam Location:  [facility] Procedure: 2D Echo and Intracardiac Opacification Agent Indications:     Dyspnea R06.00  History:         Patient has no prior history of Echocardiogram examinations.  Sonographer:     Overton Mam RDCS Referring  Phys:  [id number] Gillis Santa Diagnosing Phys: Julien Nordmann MD  Sonographer Comments: Technically challenging study due to limited acoustic windows, no subcostal window, suboptimal parasternal window, suboptimal apical window and patient is morbidly obese. IMPRESSIONS  1. Left ventricular ejection fraction, by estimation, is 60 to 65%. The left ventricle has normal function. The left ventricle has no regional wall motion abnormalities. Left ventricular diastolic parameters are indeterminate.  2. Right ventricular systolic function is normal. The right ventricular size is normal.  3. The mitral valve was not well visualized. No evidence of mitral valve regurgitation. No evidence of mitral stenosis.  4. Challenging images secondary to body habitus, definity used. FINDINGS  Left Ventricle: Left ventricular ejection fraction, by estimation, is 60 to 65%. The left ventricle has normal function. The left ventricle has no regional wall motion abnormalities. Definity contrast agent was given IV to delineate the left ventricular  endocardial borders. The left ventricular internal cavity size was normal in size. There is no left ventricular hypertrophy. Left ventricular diastolic parameters are indeterminate. Right Ventricle: The right ventricular size is normal. No increase in right ventricular wall thickness. Right ventricular systolic function is normal. Left Atrium: Left atrial size was not well visualized. Right Atrium: Right atrial size was not well visualized. Pericardium: The pericardium was not well visualized. Mitral Valve: The mitral valve was not well visualized. No evidence of mitral valve regurgitation. No evidence of mitral valve stenosis. Tricuspid Valve: The tricuspid valve is not well visualized. Tricuspid valve regurgitation is not demonstrated. No evidence of tricuspid stenosis. Aortic Valve: The aortic valve was not well visualized. Aortic valve regurgitation is not visualized. No aortic stenosis is  present. Pulmonic Valve: The pulmonic valve was not well visualized. Pulmonic valve regurgitation is not visualized. No evidence of pulmonic stenosis. Aorta: The aortic root is normal in size and structure and the aortic root was not well visualized. Venous: The pulmonary veins were not well visualized. The inferior vena cava was not well visualized. The inferior vena cava is normal in size with greater than  50% respiratory variability, suggesting right atrial pressure of 3 mmHg. IAS/Shunts: No atrial level shunt detected by color flow Doppler.  LEFT VENTRICLE PLAX 2D LVIDd:         4.83 cm  Diastology LVIDs:         3.17 cm  LV e' lateral: 12.60 cm/s LV PW:         1.33 cm LV IVS:        1.44 cm LVOT diam:     2.10 cm LVOT Area:     3.46 cm  LEFT ATRIUM         Index LA diam:    3.40 cm 0.99 cm/m                        PULMONIC VALVE AORTA                 PV Vmax:       0.87 m/s Ao Root diam: 2.70 cm PV Peak grad:  3.0 mmHg   SHUNTS Systemic Diam: 2.10 cm Julien Nordmann MD Electronically signed by Julien Nordmann MD Signature Date/Time: 08/24/2020/1:48:14 PM    Final      The results of significant diagnostics from this hospitalization (including imaging, microbiology, ancillary and laboratory) are listed below for reference.     Microbiology: Recent Results (from the past 240 hour(s))  Culture, blood (Routine x 2)     Status: None (Preliminary result)   Collection Time: 08/22/20 11:10 PM   Specimen: BLOOD  Result Value Ref Range Status   Specimen Description BLOOD BLOOD LEFT HAND  Final   Special Requests   Final    BOTTLES DRAWN AEROBIC AND ANAEROBIC Blood Culture results may not be optimal due to an excessive volume of blood received in culture bottles   Culture   Final    NO GROWTH 4 DAYS Performed at [facility], [address] [place], [place] [address]    Report Status PENDING  Incomplete  Aerobic/Anaerobic Culture w Gram Stain (surgical/deep wound)     Status: Abnormal  (Preliminary result)   Collection Time: 08/23/20  2:36 AM   Specimen: Foot  Result Value Ref Range Status   Specimen Description   Final    FOOT Performed at [facility], [address], [place], [place] [address]    Special Requests   Final    NONE Performed at [facility], [address], [place], [place] [address]    Gram Stain   Final    RARE WBC PRESENT,BOTH PMN AND MONONUCLEAR RARE GRAM POSITIVE COCCI IN PAIRS Performed at [facility], [address], [place], [place] [address]    Culture (A)  Final    MULTIPLE ORGANISMS PRESENT, NONE PREDOMINANT NO ANAEROBES ISOLATED; CULTURE IN PROGRESS FOR 5 DAYS    Report Status PENDING  Incomplete  Culture, blood (Routine X 2) w Reflex to ID Panel     Status: None (Preliminary result)   Collection Time: 08/23/20  2:36 AM   Specimen: BLOOD  Result Value Ref Range Status   Specimen Description BLOOD RIGHT ASSIST CONTROL  Final   Special Requests   Final    BOTTLES DRAWN AEROBIC AND ANAEROBIC Blood Culture adequate volume   Culture   Final    NO GROWTH 3 DAYS Performed at [facility], [address] [place], [place] [address]    Report Status PENDING  Incomplete  MRSA Next Gen by PCR,  Nasal     Status: None   Collection Time: 08/23/20  8:24 AM   Specimen: Nasopharyngeal Swab; Nasal Swab  Result Value Ref Range Status   MRSA by PCR Next Gen NOT DETECTED NOT DETECTED Final    Comment: (NOTE) The GeneXpert MRSA Assay (FDA approved for NASAL specimens only), is one component of a comprehensive MRSA colonization surveillance program. It is not intended to diagnose MRSA infection nor to guide or monitor treatment for MRSA infections. Test performance is not FDA approved in patients less than 51 years old. Performed at [facility], [address] [place], [place] [address]   SARS CORONAVIRUS 2 (TAT 6-24 HRS) Nasopharyngeal Nasopharyngeal Swab     Status: None   Collection Time:  08/23/20  8:24 AM   Specimen: Nasopharyngeal Swab  Result Value Ref Range Status   SARS Coronavirus 2 NEGATIVE NEGATIVE Final    Comment: (NOTE) SARS-CoV-2 target nucleic acids are NOT DETECTED.  The SARS-CoV-2 RNA is generally detectable in upper and lower respiratory specimens during the acute phase of infection. Negative results do not preclude SARS-CoV-2 infection, do not rule out co-infections with other pathogens, and should not be used as the sole basis for treatment or other patient management decisions. Negative results must be combined with clinical observations, patient history, and epidemiological information. The expected result is Negative.  Fact Sheet for Patients: HairSlick.nohttps://www.fda.gov/media/138098/download  Fact Sheet for Healthcare Providers: quierodirigir.comhttps://www.fda.gov/media/138095/download  This test is not yet approved or cleared by the [place] FDA and  has been authorized for detection and/or diagnosis of SARS-CoV-2 by FDA under an Emergency Use Authorization (EUA). This EUA will remain  in effect (meaning this test can be used) for the duration of the COVID-19 declaration under Se ction 564(b)(1) of the Act, 21 U.S.C. section 360bbb-3(b)(1), unless the authorization is terminated or revoked sooner.  Performed at [facility], [address] [place], [place] [address]      Labs: BNP (last 3 results) Recent Labs    08/22/20 2310 08/23/20 2010  BNP 41.7 20.1   Basic Metabolic Panel: Recent Labs  Lab 08/22/20 2310 08/23/20 0655 08/25/20 0522 08/26/20 0312  NA 134*  --  136 136  K 3.7  --  4.1 4.1  CL 97*  --  103 101  CO2 24  --  26 25  GLUCOSE 148*  --  145* 146*  BUN 15  --  13 15  CREATININE 1.13 1.05 0.88 0.89  CALCIUM 9.1  --  8.8* 8.8*  MG  --   --  2.1 1.9   Liver Function Tests: Recent Labs  Lab 08/22/20 2310  AST 22  ALT 21  ALKPHOS 94  BILITOT 0.5  PROT 8.8*  ALBUMIN 3.2*   No results for input(s): LIPASE, AMYLASE  in the last 168 hours. No results for input(s): AMMONIA in the last 168 hours. CBC: Recent Labs  Lab 08/22/20 2310 08/23/20 0655 08/25/20 0522 08/26/20 0312  WBC 14.2* 14.6* 12.0* 12.8*  NEUTROABS 10.5*  --   --   --   HGB 12.1* 10.6* 10.0* 10.1*  HCT 37.4* 33.2* 31.7* 32.2*  MCV 91.2 89.7 91.1 91.7  PLT 434* 393 424* 443*   Cardiac Enzymes: No results for input(s): CKTOTAL, CKMB, CKMBINDEX, TROPONINI in the last 168 hours. BNP: Invalid input(s): POCBNP CBG: Recent Labs  Lab 08/25/20 0815 08/25/20 1207 08/25/20 1541 08/25/20 2018 08/26/20 0759  GLUCAP 141* 184* 179* 147* 147*   D-Dimer No results for  input(s): DDIMER in the last 72 hours. Hgb A1c No results for input(s): HGBA1C in the last 72 hours. Lipid Profile No results for input(s): CHOL, HDL, LDLCALC, TRIG, CHOLHDL, LDLDIRECT in the last 72 hours. Thyroid function studies No results for input(s): TSH, T4TOTAL, T3FREE, THYROIDAB in the last 72 hours.  Invalid input(s): FREET3 Anemia work up No results for input(s): VITAMINB12, FOLATE, FERRITIN, TIBC, IRON, RETICCTPCT in the last 72 hours. Urinalysis    Component Value Date/Time   COLORURINE YELLOW (A) 08/23/2020 0900   APPEARANCEUR HAZY (A) 08/23/2020 0900   LABSPEC 1.025 08/23/2020 0900   PHURINE 6.0 08/23/2020 0900   GLUCOSEU NEGATIVE 08/23/2020 0900   HGBUR NEGATIVE 08/23/2020 0900   BILIRUBINUR SMALL (A) 08/23/2020 0900   KETONESUR NEGATIVE 08/23/2020 0900   PROTEINUR 100 (A) 08/23/2020 0900   NITRITE NEGATIVE 08/23/2020 0900   LEUKOCYTESUR NEGATIVE 08/23/2020 0900   Sepsis Labs Invalid input(s): PROCALCITONIN,  WBC,  LACTICIDVEN Microbiology Recent Results (from the past 240 hour(s))  Culture, blood (Routine x 2)     Status: None (Preliminary result)   Collection Time: 08/22/20 11:10 PM   Specimen: BLOOD  Result Value Ref Range Status   Specimen Description BLOOD BLOOD LEFT HAND  Final   Special Requests   Final    BOTTLES DRAWN AEROBIC AND  ANAEROBIC Blood Culture results may not be optimal due to an excessive volume of blood received in culture bottles   Culture   Final    NO GROWTH 4 DAYS Performed at [facility], [address] [place], [place] [address]    Report Status PENDING  Incomplete  Aerobic/Anaerobic Culture w Gram Stain (surgical/deep wound)     Status: Abnormal (Preliminary result)   Collection Time: 08/23/20  2:36 AM   Specimen: Foot  Result Value Ref Range Status   Specimen Description   Final    FOOT Performed at [facility], [address] [place], [place] [address]    Special Requests   Final    NONE Performed at [facility], [address] [place], [place] [address]    Gram Stain   Final    RARE WBC PRESENT,BOTH PMN AND MONONUCLEAR RARE GRAM POSITIVE COCCI IN PAIRS Performed at [facility], [address] [place], [place] [address]    Culture (A)  Final    MULTIPLE ORGANISMS PRESENT, NONE PREDOMINANT NO ANAEROBES ISOLATED; CULTURE IN PROGRESS FOR 5 DAYS    Report Status PENDING  Incomplete  Culture, blood (Routine X 2) w Reflex to ID Panel     Status: None (Preliminary result)   Collection Time: 08/23/20  2:36 AM   Specimen: BLOOD  Result Value Ref Range Status   Specimen Description BLOOD RIGHT ASSIST CONTROL  Final   Special Requests   Final    BOTTLES DRAWN AEROBIC AND ANAEROBIC Blood Culture adequate volume   Culture   Final    NO GROWTH 3 DAYS Performed at [facility], [address], [place], [place] [address]    Report Status PENDING  Incomplete  MRSA Next Gen by PCR, Nasal     Status: None   Collection Time: 08/23/20  8:24 AM   Specimen: Nasopharyngeal Swab; Nasal Swab  Result Value Ref Range Status   MRSA by PCR Next Gen NOT DETECTED NOT DETECTED Final    Comment: (NOTE) The GeneXpert MRSA Assay (FDA approved for NASAL specimens only), is one component of a comprehensive MRSA colonization surveillance program. It is not  intended to diagnose MRSA infection nor to guide or monitor treatment for MRSA infections. Test performance is not FDA approved in patients less than 45 years old. Performed at [facility], [address] [place], [place] [address]   SARS CORONAVIRUS 2 (TAT 6-24 HRS) Nasopharyngeal Nasopharyngeal Swab     Status: None   Collection Time: 08/23/20  8:24 AM   Specimen: Nasopharyngeal Swab  Result Value Ref Range Status   SARS Coronavirus 2 NEGATIVE NEGATIVE Final    Comment: (NOTE) SARS-CoV-2 target nucleic acids are NOT DETECTED.  The SARS-CoV-2 RNA is generally detectable in upper and lower respiratory specimens during the acute phase of infection. Negative results do not preclude SARS-CoV-2 infection, do not rule out co-infections with other pathogens, and should not be used as the sole basis for treatment or other patient management decisions. Negative results must be combined with clinical observations, patient history, and epidemiological information. The expected result is Negative.  Fact Sheet for Patients: HairSlick.no  Fact Sheet for Healthcare Providers: quierodirigir.com  This test is not yet approved or cleared by the [place] FDA and  has been authorized for detection and/or diagnosis of SARS-CoV-2 by FDA under an Emergency Use Authorization (EUA). This EUA will remain  in effect (meaning this test can be used) for the duration of the COVID-19 declaration under Se ction 564(b)(1) of the Act, 21 U.S.C. section 360bbb-3(b)(1), unless the authorization is terminated or revoked sooner.  Performed at [facility], [address], [place], [place] [address]      Time coordinating discharge:  I have spent 35 minutes face to face with the patient and on the ward discussing the patients care, assessment, plan and disposition with other care givers. >50% of the time was devoted counseling the  patient about the risks and benefits of treatment/Discharge disposition and coordinating care.   SIGNED:   Dimple Nanas, MD  [facility] 08/26/2020, 11:51 AM   If 7PM-7AM, please contact night-coverage

## 2020-08-26 NOTE — Progress Notes (Signed)
   PODIATRY CONSULTATION  NAME [name] [name] MRN [id number] DOB 13-Feb-1970 DOA 08/23/2020   Subjective: Patient presents today about to be discharged to home or self care.  States that he is feeling well.  Dressings were applied yesterday to the foot.  Patient is planning on following up at the [facility] wound care center.  Past Medical History:  Diagnosis Date   DM (diabetes mellitus) (HCC)    Gout    HTN (hypertension)    Obesity     CBC Latest Ref Rng & Units 08/26/2020 08/25/2020 08/23/2020  WBC 4.0 - 10.5 K/uL 12.8(H) 12.0(H) 14.6(H)  Hemoglobin 13.0 - 17.0 g/dL 10.1(L) 10.0(L) 10.6(L)  Hematocrit 39.0 - 52.0 % 32.2(L) 31.7(L) 33.2(L)  Platelets 150 - 400 K/uL 443(H) 424(H) 393    BMP Latest Ref Rng & Units 08/26/2020 08/25/2020 08/23/2020  Glucose 70 - 99 mg/dL 656(C) 127(N) -  BUN 6 - 20 mg/dL 15 13 -  Creatinine 1.70 - 1.24 mg/dL 0.17 4.94 4.96  Sodium 135 - 145 mmol/L 136 136 -  Potassium 3.5 - 5.1 mmol/L 4.1 4.1 -  Chloride 98 - 111 mmol/L 101 103 -  CO2 22 - 32 mmol/L 25 26 -  Calcium 8.9 - 10.3 mg/dL 7.5(F) 1.6(B) -   Overall the wound appears to be significantly improved since admission.  Spouse states that there is significant improvement.  No drainage noted.  No malodor noted.  No exposed bone muscle tendon ligament or joint.  Periwound is intact and dry.    ASSESSMENT/PLAN OF CARE RT lower extremity cellulitis with wound Recommendations for Outpatient Follow-up:  Follow up with PCP in 1-2 weeks Please obtain BMP/CBC in one week your next doctors visit.  Oral Augmentin twice daily for 10 more days prescribed Follow-up outpatient podiatry, instructions given Daily dressing changes with gauze and mupirocin ointment 2% daily.  Wife is able to perform this at home. 6.  Podiatry to sign off    Felecia Shelling, DPM [facility]  Dr. Felecia Shelling, DPM    [address] [place], [place] [address]                 Office [phone]  Fax [phone]

## 2020-08-26 NOTE — Progress Notes (Signed)
Discharge went over with patient and wife. All belongings sent with patient. IV removed. Walker sent with patient. Patient picked up by transport to take down to personal vehicle.

## 2020-08-27 LAB — CULTURE, BLOOD (ROUTINE X 2): Culture: NO GROWTH

## 2020-08-28 LAB — AEROBIC/ANAEROBIC CULTURE W GRAM STAIN (SURGICAL/DEEP WOUND)

## 2020-08-28 LAB — CULTURE, BLOOD (ROUTINE X 2)
Culture: NO GROWTH
Special Requests: ADEQUATE

## 2020-09-05 ENCOUNTER — Other Ambulatory Visit: Payer: Self-pay | Admitting: Podiatry

## 2020-09-05 ENCOUNTER — Ambulatory Visit: Payer: BC Managed Care – PPO

## 2020-09-05 ENCOUNTER — Ambulatory Visit: Payer: BC Managed Care – PPO | Admitting: Podiatry

## 2020-09-05 ENCOUNTER — Other Ambulatory Visit: Payer: Self-pay

## 2020-09-05 DIAGNOSIS — L03115 Cellulitis of right lower limb: Secondary | ICD-10-CM

## 2020-09-05 DIAGNOSIS — M79671 Pain in right foot: Secondary | ICD-10-CM | POA: Diagnosis not present

## 2020-09-05 MED ORDER — AMOXICILLIN-POT CLAVULANATE 875-125 MG PO TABS: 1.0000 | ORAL_TABLET | Freq: Two times a day (BID) | ORAL | 0 refills | Status: DC

## 2020-09-09 ENCOUNTER — Telehealth: Payer: Self-pay | Admitting: *Deleted

## 2020-09-09 MED ORDER — DOXYCYCLINE HYCLATE 100 MG PO TABS: 100.0000 mg | ORAL_TABLET | Freq: Two times a day (BID) | ORAL | 0 refills | Status: DC

## 2020-09-09 NOTE — Telephone Encounter (Signed)
"  I had a visit with you guys on Friday.  You guys made a prescription for me for an antibiotic.  That antibiotic is on back order at [facility] and they don't have access to it.  They had actually sent you guys a question asking if there's any other alternatives but you guys haven't got in contact with them.  Please give me a call."

## 2020-09-09 NOTE — Telephone Encounter (Signed)
Per Dr. Logan Bores, I called and informed the patient that Dr. Logan Bores sent in a prescription for Doxycycline 100 mg, quantity of 20, sig: bid., 0 refills to [facility].

## 2020-09-11 LAB — WOUND CULTURE: Organism ID, Bacteria: NONE SEEN

## 2020-09-21 NOTE — Progress Notes (Signed)
   Subjective:  50 y.o. male with PMHx of diabetes mellitus presenting today post discharge from [facility] hospital.  Patient states that there is some improvement from his wound.  His wife helps keep the dressings covered.  He presents for outpatient follow-up treatment and evaluation   Past Medical History:  Diagnosis Date   DM (diabetes mellitus) (HCC)    Gout    HTN (hypertension)    Obesity        Objective/Physical Exam General: The patient is alert and oriented x3 in no acute distress.  Dermatology:  Wound #1 noted to the right foot measuring approximately 1.0 x 0.5 x 2.5 cm in depth cm (LxWxD).  The wound does probe down to the deeper compartments of the foot but not to bone  To the noted ulceration(s), there is no eschar. There is a moderate amount of slough, fibrin, and necrotic tissue noted. Granulation tissue and wound base is red. There is a minimal amount of serosanguineous drainage noted. There is no exposed bone muscle-tendon ligament or joint. There is no malodor. Periwound integrity is intact. Skin is warm, dry and supple bilateral lower extremities.  Vascular: Palpable pedal pulses bilaterally.  Moderate edema noted.  No significant erythema capillary refill within normal limits.  Neurological: Epicritic and protective threshold diminished bilaterally.   Musculoskeletal Exam: Range of motion within normal limits to all pedal and ankle joints bilateral. Muscle strength 5/5 in all groups bilateral.   Assessment: 1.  Ulcer right foot secondary to diabetes mellitus 2. diabetes mellitus w/ peripheral neuropathy   Plan of Care:  1. Patient was evaluated. 2.  Refill prescription for Augmentin 875/125mg  twice daily #20 3.  Today cultures were taken and sent to pathology for culture and sensitivity 4.  Postsurgical shoe dispensed.  Weightbearing as tolerated 5.  Return to clinic in 3 weeks   Felecia Shelling, DPM [facility]  Dr. Felecia Shelling, DPM     [address]                                        [place], [place] [address]                Office [phone]  Fax [phone]

## 2020-09-26 ENCOUNTER — Ambulatory Visit: Payer: BC Managed Care – PPO | Admitting: Podiatry

## 2020-09-26 ENCOUNTER — Encounter: Payer: Self-pay | Admitting: Podiatry

## 2020-09-26 ENCOUNTER — Other Ambulatory Visit: Payer: Self-pay

## 2020-09-26 DIAGNOSIS — L97512 Non-pressure chronic ulcer of other part of right foot with fat layer exposed: Secondary | ICD-10-CM | POA: Diagnosis not present

## 2020-09-26 DIAGNOSIS — E08621 Diabetes mellitus due to underlying condition with foot ulcer: Secondary | ICD-10-CM | POA: Diagnosis not present

## 2020-09-26 MED ORDER — DOXYCYCLINE HYCLATE 100 MG PO TABS: 100.0000 mg | ORAL_TABLET | Freq: Two times a day (BID) | ORAL | 0 refills | Status: DC

## 2020-09-26 MED ORDER — AMOXICILLIN-POT CLAVULANATE 875-125 MG PO TABS: 1.0000 | ORAL_TABLET | Freq: Two times a day (BID) | ORAL | 0 refills | Status: DC

## 2020-09-26 NOTE — Progress Notes (Signed)
   Subjective:  50 y.o. male with PMHx of diabetes mellitus presenting today for follow-up evaluation of cellulitis with a diabetic foot ulcer to the right lower extremity.  They have been applying the antibiotic ointment and dressing foot as instructed.  No new complaints at this time   Past Medical History:  Diagnosis Date   DM (diabetes mellitus) (HCC)    Gout    HTN (hypertension)    Obesity      Objective/Physical Exam General: The patient is alert and oriented x3 in no acute distress.  Dermatology:  Wound #1 noted to the right foot measuring approximately 1.0 x 0.5 x 2.5 cm in depth cm (LxWxD).  The wound does probe down to the deeper compartments of the foot but not to bone  To the noted ulceration(s), there is no eschar. There is a moderate amount of slough, fibrin, and necrotic tissue noted. Granulation tissue and wound base is red. There is a minimal amount of serosanguineous drainage noted. There is no exposed bone muscle-tendon ligament or joint. There is no malodor. Periwound integrity is intact. Skin is warm, dry and supple bilateral lower extremities.  Vascular: Palpable pedal pulses bilaterally.  Heavy edema with some moderate erythema noted.    Neurological: Epicritic and protective threshold diminished bilaterally.   Musculoskeletal Exam: Range of motion within normal limits to all pedal and ankle joints bilateral. Muscle strength 5/5 in all groups bilateral.   Assessment: 1.  Ulcer right foot secondary to diabetes mellitus 2. diabetes mellitus w/ peripheral neuropathy   Plan of Care:  1. Patient was evaluated.  Cultures reviewed 2.  Refill prescription for Augmentin 875/125mg  twice daily #20 3.  Recommend Betadine ointment and light dressing daily.  Betadine ointment provided 4.  Advised the patient to go back immediately to the emergency department in hospital if the patient begins to feel fever or symptoms of recurrence of the cellulitis of the foot and leg   5.  The patient may soak the foot daily with warm water and Epsom salt  6.  Return to clinic in 3 weeks for follow-up x-ray  Felecia Shelling, DPM [facility]  Dr. Felecia Shelling, DPM    [address] [place], [place] [address]                Office [phone]  Fax [phone]

## 2020-10-28 ENCOUNTER — Ambulatory Visit: Payer: BC Managed Care – PPO | Admitting: Podiatry

## 2021-04-28 ENCOUNTER — Ambulatory Visit (INDEPENDENT_AMBULATORY_CARE_PROVIDER_SITE_OTHER): Payer: BC Managed Care – PPO

## 2021-04-28 ENCOUNTER — Other Ambulatory Visit: Payer: Self-pay | Admitting: Podiatry

## 2021-04-28 ENCOUNTER — Ambulatory Visit: Payer: BC Managed Care – PPO | Admitting: Podiatry

## 2021-04-28 DIAGNOSIS — R600 Localized edema: Secondary | ICD-10-CM

## 2021-04-28 DIAGNOSIS — B351 Tinea unguium: Secondary | ICD-10-CM | POA: Diagnosis not present

## 2021-04-28 DIAGNOSIS — M79675 Pain in left toe(s): Secondary | ICD-10-CM

## 2021-04-28 DIAGNOSIS — E0843 Diabetes mellitus due to underlying condition with diabetic autonomic (poly)neuropathy: Secondary | ICD-10-CM

## 2021-04-28 DIAGNOSIS — L97512 Non-pressure chronic ulcer of other part of right foot with fat layer exposed: Secondary | ICD-10-CM

## 2021-04-28 DIAGNOSIS — M79674 Pain in right toe(s): Secondary | ICD-10-CM

## 2021-04-28 NOTE — Progress Notes (Signed)
? ?  SUBJECTIVE ?Patient with a history of diabetes mellitus presents to office today complaining of elongated, thickened nails that cause pain while ambulating in shoes.  Patient is unable to trim their own nails. ? ?Patient also states that for the past 3-4 months he has been experiencing significant swelling in his right foot and ankle compared to his left.  He does have history of an ulcer to the arch of the right foot which has healed.  He was last seen here in the office on 09/26/2020.  He presents for further treatment and evaluation patient is here for further evaluation and treatment. ? ? ?Past Medical History:  ?Diagnosis Date  ? DM (diabetes mellitus) (HCC)   ? Gout   ? HTN (hypertension)   ? Obesity   ? ? ?OBJECTIVE ?General Patient is awake, alert, and oriented x 3 and in no acute distress. ?Derm Skin is dry and supple bilateral. Negative open lesions or macerations. Remaining integument unremarkable. Nails are tender, long, thickened and dystrophic with subungual debris, consistent with onychomycosis, 1-5 bilateral.  There is no open wound especially to the right foot.  No erythema that would be concerning clinically for cellulitis or infection ?Vasc Skin is cool to touch bilateral.  There is no warmth to the right foot compared to the contralateral limb.  Heavy chronic edema noted to the right foot and ankle  ?neuro Epicritic and protective threshold sensation diminished bilaterally.  ?Musculoskeletal Exam pes planus deformity noted right foot with heavy edema ?Radiographic exam RT foot no cortical irregularities or erosions.  Osseous structures are difficult to visualize due to the heavy edema of the foot.  Soft tissue swelling noted. ? ?ASSESSMENT ?1. Diabetes Mellitus w/ peripheral neuropathy ?2.  Pain due to onychomycosis of toenails bilateral ?3.  Heavy chronic edema right foot and ankle x3-4 months ?PLAN OF CARE ?1. Patient evaluated today. ?2. Instructed to maintain good pedal hygiene and foot  care. Stressed importance of controlling blood sugar.  ?3. Mechanical debridement of nails 1-5 bilaterally performed using a nail nipper. Filed with dremel without incident.  ?4.  Patient has a follow-up appointment with PCP next month.  Recommend follow-up with PCP for fluid retention right lower extremity  ?5.  The patient informed me that in the mornings there is no swelling to his foot.  It only progresses throughout the day which I do not suspect is cellulitis or infection related, especially since the foot is not warm or hot to touch compared to the contralateral limb. ?6.  Return to clinic in 3 mos.  ? ? ? ?Felecia Shelling, DPM ?[facility] ? ?Dr. Felecia Shelling, DPM  ?  ?[address]                                      ?[place], [place] [address]                ?Office [phone]  ?Fax [phone] ? ? ? ? ? ?

## 2021-07-28 ENCOUNTER — Ambulatory Visit (INDEPENDENT_AMBULATORY_CARE_PROVIDER_SITE_OTHER): Payer: BC Managed Care – PPO | Admitting: Podiatry

## 2021-07-28 DIAGNOSIS — E0843 Diabetes mellitus due to underlying condition with diabetic autonomic (poly)neuropathy: Secondary | ICD-10-CM

## 2021-07-28 DIAGNOSIS — E119 Type 2 diabetes mellitus without complications: Secondary | ICD-10-CM

## 2021-07-28 DIAGNOSIS — B351 Tinea unguium: Secondary | ICD-10-CM

## 2021-07-28 NOTE — Progress Notes (Signed)
   Chief Complaint  Patient presents with   foot care    Patient is here for diabetic foot care.    SUBJECTIVE Patient with a history of diabetes mellitus and morbid obesity presents to office today complaining of elongated, thickened nails that cause pain while ambulating in shoes.  Patient is unable to trim their own nails.  Patient also has developed calluses to the bilateral feet and would like to have them evaluated.  Patient is here for further evaluation and treatment.   Past Medical History:  Diagnosis Date   DM (diabetes mellitus) (HCC)    Gout    HTN (hypertension)    Obesity    No past surgical history on file. No Known Allergies  OBJECTIVE General Patient is awake, alert, and oriented x 3 and in no acute distress. Derm Skin is dry and supple bilateral. Negative open lesions or macerations. Remaining integument unremarkable. Nails are tender, long, thickened and dystrophic with subungual debris, consistent with onychomycosis, 1-5 bilateral. No signs of infection noted.  There is also some hyperkeratotic preulcerative callus tissue noted especially to the left great toe and bilateral feet Vasc  DP and PT pedal pulses palpable bilaterally. Temperature gradient within normal limits.  Neuro Epicritic and protective threshold sensation diminished bilaterally.  Musculoskeletal Exam No symptomatic pedal deformities noted bilateral. Muscular strength within normal limits.  ASSESSMENT 1. Diabetes Mellitus w/ peripheral neuropathy 2.  Symptomatic preulcerative callus lesions bilateral feet  3.  Pain due to onychomycosis of toenails bilateral  PLAN OF CARE 1. Patient evaluated today.  Comprehensive diabetic foot exam performed today 2. Instructed to maintain good pedal hygiene and foot care. Stressed importance of controlling blood sugar.  3. Mechanical debridement of nails 1-5 bilaterally performed using a nail nipper. Filed with dremel without incident.  4.  Excisional  debridement of the hyperkeratotic preulcerative callus tissue was performed as a courtesy for the patient using a tissue nipper without incident or bleeding  5.  Handicap parking placard form provided for the patient today  6.  Return to clinic in 3 mos.   *Son is starting college playing defensive back    Felecia Shelling, DPM [facility]  Dr. Felecia Shelling, DPM    [address] [place], [place] [address]                Office [phone]  Fax [phone]

## 2021-10-30 ENCOUNTER — Encounter: Payer: Self-pay | Admitting: Podiatry

## 2021-10-30 ENCOUNTER — Ambulatory Visit: Payer: BC Managed Care – PPO | Admitting: Podiatry

## 2021-10-30 DIAGNOSIS — M79675 Pain in left toe(s): Secondary | ICD-10-CM | POA: Diagnosis not present

## 2021-10-30 DIAGNOSIS — B351 Tinea unguium: Secondary | ICD-10-CM

## 2021-10-30 DIAGNOSIS — M79674 Pain in right toe(s): Secondary | ICD-10-CM

## 2021-10-30 NOTE — Progress Notes (Signed)
   Chief Complaint  Patient presents with   Nail Problem    "Diabetic foot care, cut my toenails and check my feet."    SUBJECTIVE Patient with a history of diabetes mellitus and morbid obesity presents to office today complaining of elongated, thickened nails that cause pain while ambulating in shoes.  Patient is unable to trim their own nails.  Patient also has developed calluses to the bilateral feet and would like to have them evaluated.  Patient is here for further evaluation and treatment.   Past Medical History:  Diagnosis Date   DM (diabetes mellitus) ([place])    Gout    HTN (hypertension)    Obesity    No past surgical history on file. No Known Allergies  OBJECTIVE General Patient is awake, alert, and oriented x 3 and in no acute distress. Derm Skin is dry and supple bilateral. Negative open lesions or macerations. Remaining integument unremarkable. Nails are tender, long, thickened and dystrophic with subungual debris, consistent with onychomycosis, 1-5 bilateral. No signs of infection noted.  There is also some hyperkeratotic preulcerative callus tissue noted especially to the left great toe and bilateral feet Vasc  DP and PT pedal pulses palpable bilaterally. Temperature gradient within normal limits.  Neuro Epicritic and protective threshold sensation diminished bilaterally.  Musculoskeletal Exam No symptomatic pedal deformities noted bilateral. Muscular strength within normal limits.  ASSESSMENT 1. Diabetes Mellitus w/ peripheral neuropathy 2.  Symptomatic preulcerative callus lesions bilateral feet  3.  Pain due to onychomycosis of toenails bilateral  PLAN OF CARE 1. Patient evaluated today.  2. Instructed to maintain good pedal hygiene and foot care. Stressed importance of controlling blood sugar.  3. Mechanical debridement of nails 1-5 bilaterally performed using a nail nipper. Filed with dremel without incident.  4.  Excisional debridement of the hyperkeratotic  preulcerative callus tissue was performed as a courtesy for the patient using a tissue nipper without incident or bleeding  5.  Handicap parking placard form provided for the patient today  6.  Return to clinic in 3 mos.   *Son is starting college playing defensive back in [place]. [place]ting this year.     Edrick Kins, DPM [facility]  Dr. Edrick Kins, DPM    [address] [place],  [address]                Office [phone]  Fax [phone]

## 2021-11-25 ENCOUNTER — Encounter: Payer: Self-pay | Admitting: Podiatry

## 2022-02-12 ENCOUNTER — Ambulatory Visit: Payer: BC Managed Care – PPO | Admitting: Podiatry

## 2022-02-23 ENCOUNTER — Ambulatory Visit (INDEPENDENT_AMBULATORY_CARE_PROVIDER_SITE_OTHER): Payer: BC Managed Care – PPO | Admitting: Podiatry

## 2022-02-23 ENCOUNTER — Encounter: Payer: Self-pay | Admitting: Podiatry

## 2022-02-23 VITALS — BP 147/94 | HR 84

## 2022-02-23 DIAGNOSIS — M79675 Pain in left toe(s): Secondary | ICD-10-CM

## 2022-02-23 DIAGNOSIS — B351 Tinea unguium: Secondary | ICD-10-CM

## 2022-02-23 DIAGNOSIS — M79674 Pain in right toe(s): Secondary | ICD-10-CM | POA: Diagnosis not present

## 2022-02-23 NOTE — Progress Notes (Signed)
   Chief Complaint  Patient presents with   Nail Problem    "Check my feet, clean them up, and cut my toenails."    SUBJECTIVE Patient with a history of diabetes mellitus and morbid obesity presents to office today complaining of elongated, thickened nails that cause pain while ambulating in shoes.  Patient is unable to trim their own nails.  Patient also has developed calluses to the bilateral feet and would like to have them evaluated.  Patient is here for further evaluation and treatment.   Past Medical History:  Diagnosis Date   DM (diabetes mellitus) ([place])    Gout    HTN (hypertension)    Obesity    No past surgical history on file. No Known Allergies  OBJECTIVE General Patient is awake, alert, and oriented x 3 and in no acute distress. Derm Skin is dry and supple bilateral. Negative open lesions or macerations. Remaining integument unremarkable. Nails are tender, long, thickened and dystrophic with subungual debris, consistent with onychomycosis, 1-5 bilateral. No signs of infection noted.  There is also some hyperkeratotic preulcerative callus tissue noted especially to the left great toe and bilateral feet Vasc  DP and PT pedal pulses palpable bilaterally. Temperature gradient within normal limits.  Neuro Epicritic and protective threshold sensation diminished bilaterally.  Musculoskeletal Exam No symptomatic pedal deformities noted bilateral. Muscular strength within normal limits.  ASSESSMENT 1. Diabetes Mellitus w/ peripheral neuropathy 2.  Symptomatic preulcerative callus lesions bilateral feet  3.  Pain due to onychomycosis of toenails bilateral  PLAN OF CARE 1. Patient evaluated today.  2. Instructed to maintain good pedal hygiene and foot care. Stressed importance of controlling blood sugar.  3. Mechanical debridement of nails 1-5 bilaterally performed using a nail nipper. Filed with dremel without incident.  4.  Excisional debridement of the hyperkeratotic  preulcerative callus tissue was performed as a courtesy for the patient using a tissue nipper without incident or bleeding  5.  Handicap parking placard form provided for the patient today  6.  Return to clinic in 3 mos.   *Son is starting college playing defensive back in [place]. [place]ting this year.     Edrick Kins, DPM [facility]  Dr. Edrick Kins, DPM    [address] [place], [place] [address]                Office [phone]  Fax [phone]

## 2022-05-28 ENCOUNTER — Ambulatory Visit: Payer: BC Managed Care – PPO | Admitting: Podiatry

## 2022-05-28 DIAGNOSIS — M79675 Pain in left toe(s): Secondary | ICD-10-CM | POA: Diagnosis not present

## 2022-05-28 DIAGNOSIS — B351 Tinea unguium: Secondary | ICD-10-CM | POA: Diagnosis not present

## 2022-05-28 DIAGNOSIS — M79674 Pain in right toe(s): Secondary | ICD-10-CM | POA: Diagnosis not present

## 2022-05-28 NOTE — Progress Notes (Signed)
   Chief Complaint  Patient presents with   Diabetes    Diabetic foot care    SUBJECTIVE Patient with a history of diabetes mellitus and morbid obesity presents to office today complaining of elongated, thickened nails that cause pain while ambulating in shoes.  Patient is unable to trim their own nails.  Patient also has developed calluses to the bilateral feet and would like to have them evaluated.  Patient is here for further evaluation and treatment.   Past Medical History:  Diagnosis Date   DM (diabetes mellitus) (HCC)    Gout    HTN (hypertension)    Obesity    No past surgical history on file. No Known Allergies  OBJECTIVE General Patient is awake, alert, and oriented x 3 and in no acute distress. Derm Skin is dry and supple bilateral. Negative open lesions or macerations. Remaining integument unremarkable. Nails are tender, long, thickened and dystrophic with subungual debris, consistent with onychomycosis, 1-5 bilateral. No signs of infection noted.  There is also some hyperkeratotic preulcerative callus tissue noted especially to the left great toe and bilateral feet Vasc  DP and PT pedal pulses palpable bilaterally. Temperature gradient within normal limits.  Neuro Epicritic and protective threshold sensation diminished bilaterally.  Musculoskeletal Exam No symptomatic pedal deformities noted bilateral. Muscular strength within normal limits.  ASSESSMENT 1. Diabetes Mellitus w/ peripheral neuropathy 2.  Symptomatic preulcerative callus lesions bilateral feet  3.  Pain due to onychomycosis of toenails bilateral  PLAN OF CARE 1. Patient evaluated today.  2. Instructed to maintain good pedal hygiene and foot care. Stressed importance of controlling blood sugar.  3. Mechanical debridement of nails 1-5 bilaterally performed using a nail nipper. Filed with dremel without incident.  4.  Excisional debridement of the hyperkeratotic preulcerative callus tissue was performed as  a courtesy for the patient using a tissue nipper without incident or bleeding  5.  Handicap parking placard form provided for the patient today  6.  Return to clinic in 3 mos.   *Son is starting college playing defensive back in [place]. [place]ting this year.     Felecia Shelling, DPM [facility]  Dr. Felecia Shelling, DPM    [address] [place], [place] [address]                Office [phone]  Fax [phone]

## 2022-08-27 ENCOUNTER — Ambulatory Visit (INDEPENDENT_AMBULATORY_CARE_PROVIDER_SITE_OTHER): Payer: BC Managed Care – PPO | Admitting: Podiatry

## 2022-08-27 ENCOUNTER — Encounter: Payer: Self-pay | Admitting: Podiatry

## 2022-08-27 DIAGNOSIS — B351 Tinea unguium: Secondary | ICD-10-CM | POA: Diagnosis not present

## 2022-08-27 DIAGNOSIS — E0843 Diabetes mellitus due to underlying condition with diabetic autonomic (poly)neuropathy: Secondary | ICD-10-CM | POA: Diagnosis not present

## 2022-08-27 DIAGNOSIS — M79675 Pain in left toe(s): Secondary | ICD-10-CM | POA: Diagnosis not present

## 2022-08-27 DIAGNOSIS — M79674 Pain in right toe(s): Secondary | ICD-10-CM

## 2022-08-27 DIAGNOSIS — E119 Type 2 diabetes mellitus without complications: Secondary | ICD-10-CM

## 2022-08-27 NOTE — Progress Notes (Signed)
   Chief Complaint  Patient presents with   Diabetes    "Check my feet and do my nails and all that type of stuff."    SUBJECTIVE Patient with a history of diabetes mellitus and morbid obesity presents to office today complaining of elongated, thickened nails that cause pain while ambulating in shoes.  Patient is unable to trim their own nails.  Patient also has developed calluses to the bilateral feet and would like to have them evaluated.  Patient is here for further evaluation and treatment.   Past Medical History:  Diagnosis Date   DM (diabetes mellitus) (HCC)    Gout    HTN (hypertension)    Obesity    No past surgical history on file. No Known Allergies  OBJECTIVE General Patient is awake, alert, and oriented x 3 and in no acute distress. Derm Skin is dry and supple bilateral. Negative open lesions or macerations. Remaining integument unremarkable. Nails are tender, long, thickened and dystrophic with subungual debris, consistent with onychomycosis, 1-5 bilateral. No signs of infection noted.  There is also some hyperkeratotic preulcerative callus tissue noted especially to the left great toe and bilateral feet Vasc  DP and PT pedal pulses palpable bilaterally. Temperature gradient within normal limits.  Neuro Epicritic and protective threshold sensation diminished bilaterally.  Musculoskeletal Exam No symptomatic pedal deformities noted bilateral. Muscular strength within normal limits.  ASSESSMENT 1. Diabetes Mellitus w/ peripheral neuropathy 2.  Symptomatic preulcerative callus lesions bilateral feet  3.  Pain due to onychomycosis of toenails bilateral  PLAN OF CARE 1. Patient evaluated today. Encounter for diabetic foot exam performed today 2. Instructed to maintain good pedal hygiene and foot care. Stressed importance of controlling blood sugar.  3. Mechanical debridement of nails 1-5 bilaterally performed using a nail nipper. Filed with dremel without incident.  4.   Excisional debridement of the hyperkeratotic preulcerative callus tissue was performed as a courtesy for the patient using a tissue nipper without incident or bleeding  5.  Handicap parking placard form provided for the patient today  6.  Return to clinic in 3 mos.   *Son is starting college playing defensive back in [place]. [place] this year.     Felecia Shelling, DPM [facility]  Dr. Felecia Shelling, DPM    [address] [place], [place] [address]                Office [phone]  Fax [phone]

## 2022-10-22 ENCOUNTER — Ambulatory Visit (INDEPENDENT_AMBULATORY_CARE_PROVIDER_SITE_OTHER): Payer: BC Managed Care – PPO | Admitting: Podiatry

## 2022-10-22 DIAGNOSIS — L97922 Non-pressure chronic ulcer of unspecified part of left lower leg with fat layer exposed: Secondary | ICD-10-CM

## 2022-10-22 DIAGNOSIS — E0843 Diabetes mellitus due to underlying condition with diabetic autonomic (poly)neuropathy: Secondary | ICD-10-CM | POA: Diagnosis not present

## 2022-10-22 MED ORDER — DOXYCYCLINE HYCLATE 100 MG PO TABS: 100.0000 mg | ORAL_TABLET | Freq: Two times a day (BID) | ORAL | 0 refills | Status: AC

## 2022-11-01 ENCOUNTER — Encounter: Payer: BC Managed Care – PPO | Attending: Physician Assistant | Admitting: Physician Assistant

## 2022-11-01 ENCOUNTER — Other Ambulatory Visit: Payer: Self-pay | Admitting: Physician Assistant

## 2022-11-01 DIAGNOSIS — L97522 Non-pressure chronic ulcer of other part of left foot with fat layer exposed: Secondary | ICD-10-CM | POA: Diagnosis not present

## 2022-11-01 DIAGNOSIS — E11622 Type 2 diabetes mellitus with other skin ulcer: Secondary | ICD-10-CM | POA: Insufficient documentation

## 2022-11-01 DIAGNOSIS — I10 Essential (primary) hypertension: Secondary | ICD-10-CM | POA: Insufficient documentation

## 2022-11-01 DIAGNOSIS — E11621 Type 2 diabetes mellitus with foot ulcer: Secondary | ICD-10-CM | POA: Diagnosis present

## 2022-11-01 DIAGNOSIS — L97822 Non-pressure chronic ulcer of other part of left lower leg with fat layer exposed: Secondary | ICD-10-CM | POA: Diagnosis not present

## 2022-11-01 DIAGNOSIS — M79672 Pain in left foot: Secondary | ICD-10-CM

## 2022-11-01 NOTE — Progress Notes (Signed)
[name] [name] ([id number]) [id number] Page 1 of 12 Visit Report for 11/01/2022 Allergy List Details Patient Name: Date of Service: [name] [name], [place] 11/01/2022 8:15 A M Medical Record Number: [id number] Patient Account Number: [id number] Date of Birth/Sex: Treating RN: 1970/04/18 (52 [name] Primary Care Kyrielle Urbanski: Einar Crow Other Clinician: Referring Terre Hanneman: Treating Benaiah Behan/Extender: Baxter Kail Weeks in Treatment: 0 Allergies Active Allergies No Known Drug Allergies Allergy Notes Electronic Signature(s) Signed: 11/01/2022 4:09:56 PM By: Angelina Pih Entered By: Angelina Pih on 11/01/2022 08:38:39 -------------------------------------------------------------------------------- Arrival Information Details Patient Name: Date of Service: [name] [name], [place] 11/01/2022 8:15 A M Medical Record Number: [id number] Patient Account Number: [id number] Date of Birth/Sex: Treating RN: 29-Aug-1970 (71 [name] Primary Care Agata Lucente: Einar Crow Other Clinician: Referring Burley Kopka: Treating Kahlie Deutscher/Extender: Florentina Jenny in Treatment: 0 Visit Information Patient Arrived: Ambulatory Arrival Time: 08:26 Accompanied By: self Transfer Assistance: None Patient Identification Verified: Yes Secondary Verification Process Completed: Yes Electronic Signature(s) Signed: 11/01/2022 4:09:56 PM By: Angelina Pih Entered By: Angelina Pih on 11/01/2022 08:38:07 [name] [name] ([id number]) [id number] Page 2 of 12 -------------------------------------------------------------------------------- Clinic Level of Care Assessment Details Patient Name: Date of Service: [name] [name], [place] 11/01/2022 8:15 A M Medical Record Number: [id number] Patient Account Number: [id number] Date of Birth/Sex: Treating RN: 08-Dec-1970 (51 [name] Primary  Care Blakelee Allington: Einar Crow Other Clinician: Referring Lizmarie Witters: Treating Jibri Schriefer/Extender: Florentina Jenny in Treatment: 0 Clinic Level of Care Assessment Items TOOL 1 Quantity Score []  - 0 Use when EandM and Procedure is performed on INITIAL visit ASSESSMENTS - Nursing Assessment / Reassessment X- 1 20 General Physical Exam (combine w/ comprehensive assessment (listed just below) when performed on new pt. evals) X- 1 25 Comprehensive Assessment (HX, ROS, Risk Assessments, Wounds Hx, etc.) ASSESSMENTS - Wound and Skin Assessment / Reassessment []  - 0 Dermatologic / Skin Assessment (not related to wound area) ASSESSMENTS - Ostomy and/or Continence Assessment and Care []  - 0 Incontinence Assessment and Management []  - 0 Ostomy Care Assessment and Management (repouching, etc.) PROCESS - Coordination of Care X - Simple Patient / Family Education for ongoing care 1 15 []  - 0 Complex (extensive) Patient / Family Education for ongoing care X- 1 10 Staff obtains Chiropractor, Records, T Results / Process Orders est []  - 0 Staff telephones HHA, Nursing Homes / Clarify orders / etc []  - 0 Routine Transfer to another Facility (non-emergent condition) []  - 0 Routine Hospital Admission (non-emergent condition) X- 1 15 New Admissions / Manufacturing engineer / Ordering NPWT Apligraf, etc. , []  - 0 Emergency Hospital Admission (emergent condition) PROCESS - Special Needs []  - 0 Pediatric / Minor Patient Management []  - 0 Isolation Patient Management []  - 0 Hearing / Language / Visual special needs []  - 0 Assessment of Community assistance (transportation, D/C planning, etc.) []  - 0 Additional assistance / Altered mentation []  - 0 Support Surface(s) Assessment (bed, cushion, seat, etc.) INTERVENTIONS - Miscellaneous []  - 0 External ear exam []  - 0 Patient Transfer (multiple staff / Nurse, adult / Similar devices) []  - 0 Simple Staple / Suture removal (25  or less) []  - 0 Complex Staple / Suture removal (26 or more) []  - 0 Hypo/Hyperglycemic Management (do not check if billed separately) X- 1 15 Ankle / Brachial Index (ABI) - do not check if billed separately [name], [name] ([id number]) [id number] Page 3 of 12 Has the patient  been seen at the hospital within the last three years: Yes Total Score: 100 Level Of Care: New/Established - Level 3 Electronic Signature(s) Signed: 11/01/2022 4:09:56 PM By: Angelina Pih Entered By: Angelina Pih on 11/01/2022 13:44:13 -------------------------------------------------------------------------------- Encounter Discharge Information Details Patient Name: Date of Service: [address] [name], [name] 11/01/2022 8:15 A M Medical Record Number: [id number] Patient Account Number: [id number] Date of Birth/Sex: Treating RN: Jul 29, 1970 (52 [name] Primary Care Vann Okerlund: Einar Crow Other Clinician: Referring Zakyla Tonche: Treating Daine Gunther/Extender: Florentina Jenny in Treatment: 0 Encounter Discharge Information Items Post Procedure Vitals Discharge Condition: Stable Temperature (F): 98.1 Ambulatory Status: Ambulatory Pulse (bpm): 91 Discharge Destination: Home Respiratory Rate (breaths/min): 20 Transportation: Private Auto Blood Pressure (mmHg): 170/94 Accompanied By: self Schedule Follow-up Appointment: Yes Clinical Summary of Care: Electronic Signature(s) Signed: 11/01/2022 1:48:42 PM By: Angelina Pih Entered By: Angelina Pih on 11/01/2022 13:48:42 -------------------------------------------------------------------------------- Lower Extremity Assessment Details Patient Name: Date of Service: [name] [name], [place] 11/01/2022 8:15 A M Medical Record Number: [id number] Patient Account Number: [id number] Date of Birth/Sex: Treating RN: [birth year] (1 [name] Primary Care Kamariya Blevens: Einar Crow Other  Clinician: Referring Raghad Lorenz: Treating Dorrian Doggett/Extender: Baxter Kail Weeks in Treatment: 0 Edema Assessment Assessed: [Left: No] [Right: No] Edema: [Left: Ye] [Right: s] Calf Left: Right: Point of Measurement: 39 cm From Medial Instep 53 cm [name], [name] ([id number]) [id number] Page 4 of 12 Ankle Left: Right: Point of Measurement: 12 cm From Medial Instep 31.5 cm Vascular Assessment Pulses: Dorsalis Pedis Palpable: [Left:Yes] Posterior Tibial Palpable: [Left:Yes] Extremity colors, hair growth, and conditions: Extremity Color: [Left:Normal] Hair Growth on Extremity: [Left:No] Temperature of Extremity: [Left:Warm] Capillary Refill: [Left:< 3 seconds] Blood Pressure: Brachial: [Left:170] Ankle: [Left:Dorsalis Pedis: 0 0.00] Toe Nail Assessment Left: Right: Thick: Yes Discolored: No Deformed: No Improper Length and Hygiene: Yes Electronic Signature(s) Signed: 11/01/2022 4:09:56 PM By: Angelina Pih Entered By: Angelina Pih on 11/01/2022 09:06:25 -------------------------------------------------------------------------------- Multi Wound Chart Details Patient Name: Date of Service: [name] [name], [name] 11/01/2022 8:15 A M Medical Record Number: [id number] Patient Account Number: [id number] Date of Birth/Sex: Treating RN: [birth year] (69 [name] Primary Care Kennie Snedden: Einar Crow Other Clinician: Referring Havah Ammon: Treating Mayra Jolliffe/Extender: Baxter Kail Weeks in Treatment: 0 Vital Signs Height(in): 74 Pulse(bpm): 84 Weight(lbs): 546 Blood Pressure(mmHg): 170/94 Body Mass Index(BMI): 70.1 Temperature(F): 98.1 Respiratory Rate(breaths/min): 20 [1:Photos:] [[id number] Page 5 of 12] Left, Plantar Foot Left, Anterior Lower Leg Left, Lateral Lower Leg Wound Location: Gradually Appeared Gradually Appeared Gradually Appeared Wounding Event: Diabetic  Wound/Ulcer of the Lower Diabetic Wound/Ulcer of the Lower Diabetic Wound/Ulcer of the Lower Primary Etiology: Extremity Extremity Extremity Hypertension, Type II Diabetes, Gout Hypertension, Type II Diabetes, Gout Hypertension, Type II Diabetes, Gout Comorbid History: 08/19/2022 08/19/2022 08/19/2022 Date Acquired: 0 0 0 Weeks of Treatment: Open Open Open Wound Status: No No No Wound Recurrence: 0.8x1.2x0.3 1.5x1.3x0.1 1.5x1.2x0.2 Measurements L x W x D (cm) 0.754 1.532 1.414 A (cm) : rea 0.226 0.153 0.283 Volume (cm) : 10 Starting Position 1 (o'clock): 2 Ending Position 1 (o'clock): 0.2 Maximum Distance 1 (cm): Yes No No Undermining: Grade 1 Grade 1 Grade 1 Classification: Medium Medium Medium Exudate A mount: Serosanguineous Serosanguineous Serosanguineous Exudate Type: red, brown red, brown red, brown Exudate Color: Medium (34-66%) Large (67-100%) Small (1-33%) Granulation A mount: Red Red Red Granulation Quality: Medium (34-66%) Small (1-33%) Large (67-100%) Necrotic A mount: Fat Layer (Subcutaneous Tissue): Yes Fat Layer (  Subcutaneous Tissue): Yes Fat Layer (Subcutaneous Tissue): Yes Exposed Structures: None None None Epithelialization: Debridement - Excisional N/A N/A Debridement: Pre-procedure Verification/Time Out 09:17 N/A N/A Taken: Lidocaine 4% Topical Solution N/A N/A Pain Control: Callus, Subcutaneous, Slough N/A N/A Tissue Debrided: Skin/Subcutaneous Tissue N/A N/A Level: 0.75 N/A N/A Debridement A (sq cm): rea Curette N/A N/A Instrument: Moderate N/A N/A Bleeding: Pressure N/A N/A Hemostasis A chieved: Procedure was tolerated well N/A N/A Debridement Treatment Response: 0.8x1.2x0.3 N/A N/A Post Debridement Measurements L x W x D (cm) 0.226 N/A N/A Post Debridement Volume: (cm) Debridement N/A N/A Procedures Performed: Treatment Notes Electronic Signature(s) Signed: 11/01/2022 1:41:43 PM By: Angelina Pih Entered By: Angelina Pih on 11/01/2022 13:41:43 -------------------------------------------------------------------------------- Multi-Disciplinary Care Plan Details Patient Name: Date of Service: [name] [name], [place] 11/01/2022 8:15 A M Medical Record Number: [id number] Patient Account Number: [id number] Date of Birth/Sex: Treating RN: [birth year] (62 [name] Primary Care Avory Mimbs: Einar Crow Other Clinician: Referring Hazelgrace Bonham: Treating Blaire Palomino/Extender: Baxter Kail Weeks in Treatment: 0 Active Inactive Necrotic Tissue Nursing Diagnoses: Impaired tissue integrity related to necrotic/devitalized tissue [name] [name], [name] [name] ([id number]) [phone].pdf Page 6 of 12 Knowledge deficit related to management of necrotic/devitalized tissue Goals: Necrotic/devitalized tissue will be minimized in the wound bed Date Initiated: 11/01/2022 Target Resolution Date: 12/27/2022 Goal Status: Active Patient/caregiver will verbalize understanding of reason and process for debridement of necrotic tissue Date Initiated: 11/01/2022 Date Inactivated: 11/01/2022 Target Resolution Date: 11/01/2022 Goal Status: Met Interventions: Assess patient pain level pre-, during and post procedure and prior to discharge Provide education on necrotic tissue and debridement process Treatment Activities: Apply topical anesthetic as ordered : 11/01/2022 Excisional debridement : 11/01/2022 Notes: Wound/Skin Impairment Nursing Diagnoses: Impaired tissue integrity Knowledge deficit related to ulceration/compromised skin integrity Goals: Ulcer/skin breakdown will have a volume reduction of 30% by week 4 Date Initiated: 11/01/2022 Target Resolution Date: 11/29/2022 Goal Status: Active Ulcer/skin breakdown will have a volume reduction of 50% by week 8 Date Initiated: 11/01/2022 Target Resolution Date: 12/27/2022 Goal Status: Active Ulcer/skin breakdown will have a volume  reduction of 80% by week 12 Date Initiated: 11/01/2022 Target Resolution Date: 01/24/2023 Goal Status: Active Ulcer/skin breakdown will heal within 14 weeks Date Initiated: 11/01/2022 Target Resolution Date: 02/07/2023 Goal Status: Active Interventions: Assess patient/caregiver ability to obtain necessary supplies Assess patient/caregiver ability to perform ulcer/skin care regimen upon admission and as needed Assess ulceration(s) every visit Provide education on ulcer and skin care Notes: Electronic Signature(s) Signed: 11/01/2022 1:45:57 PM By: Angelina Pih Entered By: Angelina Pih on 11/01/2022 13:45:57 -------------------------------------------------------------------------------- Pain Assessment Details Patient Name: Date of Service: [name] [name], [place] 11/01/2022 8:15 A M Medical Record Number: [id number] Patient Account Number: [id number] Date of Birth/Sex: Treating RN: Mar 26, 1970 (59 [name] Primary Care Yesli Vanderhoff: Einar Crow Other Clinician: Referring Kiyoto Slomski: Treating Aby Gessel/Extender: Baxter Kail Weeks in Treatment: 0 Active Problems [name] [name], [name] [name] ([id number]) [id number] Page 7 of 12 Location of Pain Severity and Description of Pain Patient Has Paino No Site Locations Rate the pain. Current Pain Level: 0 Pain Management and Medication Current Pain Management: Electronic Signature(s) Signed: 11/01/2022 4:09:56 PM By: Angelina Pih Entered By: Angelina Pih on 11/01/2022 08:38:13 -------------------------------------------------------------------------------- Patient/Caregiver Education Details Patient Name: Date of Service: [name] [name], [name] 10/28/2024andnbsp8:15 A M Medical Record Number: [id number] Patient Account Number: [id number] Date of Birth/Gender: Treating RN: [birth year] (84 [name] Primary Care Physician: Einar Crow Other Clinician: Referring  Physician: Treating  Physician/Extender: Baxter Kail Weeks in Treatment: 0 Education Assessment Education Provided To: Patient Education Topics Provided Welcome [facility]New [facility] Packet: o Handouts: [facility] P[facility] form, Welcome T [facility] o Methods: Explain/Verbal Responses: State content correctly Wound Debridement: Handouts: Wound Debridement Methods: Explain/Verbal Responses: State content correctly Wound/Skin Impairment: Handouts: Caring for Your Ulcer Methods: Explain/Verbal Responses: State content correctly [name] [name] ([id number]) [id number] Page 8 of 12 Electronic Signature(s) Signed: 11/01/2022 4:09:56 PM By: Angelina Pih Entered By: Angelina Pih on 11/01/2022 13:46:21 -------------------------------------------------------------------------------- Wound Assessment Details Patient Name: Date of Service: [name] [name], [place] 11/01/2022 8:15 A M Medical Record Number: [id number] Patient Account Number: [id number] Date of Birth/Sex: Treating RN: 01-Oct-1970 (74 [name] Primary Care Maud Rubendall: Einar Crow Other Clinician: Referring Angelus Hoopes: Treating [place] Hedman/Extender: Baxter Kail Weeks in Treatment: 0 Wound Status Wound Number: 1 Primary Etiology: Diabetic Wound/Ulcer of the Lower Extremity Wound Location: Left, Plantar Foot Wound Status: Open Wounding Event: Gradually Appeared Comorbid History: Hypertension, Type II Diabetes, Gout Date Acquired: 08/19/2022 Weeks Of Treatment: 0 Clustered Wound: No Photos Wound Measurements Length: (cm) 0.8 Width: (cm) 1.2 Depth: (cm) 0.3 Area: (cm) 0.754 Volume: (cm) 0.226 % Reduction in Area: % Reduction in Volume: Epithelialization: None Tunneling: No Undermining: Yes Starting Position (o'clock): 10 Ending Position (o'clock): 2 Maximum Distance: (cm) 0.2 Wound Description Classification:  Grade 1 Exudate Amount: Medium Exudate Type: Serosanguineous Exudate Color: red, brown Foul Odor After Cleansing: No Slough/Fibrino Yes Wound Bed Granulation Amount: Medium (34-66%) Exposed Structure Granulation Quality: Red Fat Layer (Subcutaneous Tissue) Exposed: Yes Necrotic Amount: Medium (34-66%) Necrotic Quality: [address] [name], [name] ([id number]) [id number] Page 9 of 12 Treatment Notes Wound #1 (Foot) Wound Laterality: Plantar, Left Cleanser Byram Ancillary Kit - 15 Day Supply Discharge Instruction: Use supplies as instructed; Kit contains: (15) Saline Bullets; (15) 3x3 Gauze; 15 pr Gloves Soap and Water Discharge Instruction: Gently cleanse wound with antibacterial soap, rinse and pat dry prior to dressing wounds Wound Cleanser Discharge Instruction: Wash your hands with soap and water. Remove old dressing, discard into plastic bag and place into trash. Cleanse the wound with Wound Cleanser prior to applying a clean dressing using gauze sponges, not tissues or cotton balls. Do not scrub or use excessive force. Pat dry using gauze sponges, not tissue or cotton balls. Peri-Wound Care Topical Primary Dressing Hydrofera Blue Ready Transfer Foam, 2.5x2.5 (in/in) Discharge Instruction: Apply Hydrofera Blue Ready to wound bed as directed Secondary Dressing (BORDER) Zetuvit Plus SILICONE BORDER Dressing 4x4 (in/in) Discharge Instruction: Please do not put silicone bordered dressings under wraps. Use non-bordered dressing only. Secured With Compression Wrap Compression Stockings Add-Ons Electronic Signature(s) Signed: 11/01/2022 4:09:56 PM By: Angelina Pih Entered By: Angelina Pih on 11/01/2022 09:07:47 -------------------------------------------------------------------------------- Wound Assessment Details Patient Name: Date of Service: [name] [name], [place] 11/01/2022 8:15 A M Medical Record Number: [id number] Patient Account  Number: [id number] Date of Birth/Sex: Treating RN: Aug 18, 1970 (57 [name] Primary Care Tashiya Souders: Einar Crow Other Clinician: Referring Kasheena Sambrano: Treating Latorsha Curling/Extender: Baxter Kail Weeks in Treatment: 0 Wound Status Wound Number: 2 Primary Etiology: Diabetic Wound/Ulcer of the Lower Extremity Wound Location: Left, Anterior Lower Leg Wound Status: Open Wounding Event: Gradually Appeared Comorbid History: Hypertension, Type II Diabetes, Gout Date Acquired: 08/19/2022 Weeks Of Treatment: 0 Clustered Wound: No Photos [name] [name], [name] [name] ([id number]) [id number] Page 10 of 12 Wound Measurements Length: (cm) 1.5 Width: (cm) 1.3 Depth: (cm) 0.1 Area: (  cm) 1.532 Volume: (cm) 0.153 % Reduction in Area: % Reduction in Volume: Epithelialization: None Tunneling: No Undermining: No Wound Description Classification: Grade 1 Exudate Amount: Medium Exudate Type: Serosanguineous Exudate Color: red, brown Foul Odor After Cleansing: No Slough/Fibrino Yes Wound Bed Granulation Amount: Large (67-100%) Exposed Structure Granulation Quality: Red Fat Layer (Subcutaneous Tissue) Exposed: Yes Necrotic Amount: Small (1-33%) Necrotic Quality: Adherent Slough Treatment Notes Wound #2 (Lower Leg) Wound Laterality: Left, Anterior Cleanser Byram Ancillary Kit - 15 Day Supply Discharge Instruction: Use supplies as instructed; Kit contains: (15) Saline Bullets; (15) 3x3 Gauze; 15 pr Gloves Soap and Water Discharge Instruction: Gently cleanse wound with antibacterial soap, rinse and pat dry prior to dressing wounds Wound Cleanser Discharge Instruction: Wash your hands with soap and water. Remove old dressing, discard into plastic bag and place into trash. Cleanse the wound with Wound Cleanser prior to applying a clean dressing using gauze sponges, not tissues or cotton balls. Do not scrub or use excessive force. Pat dry using gauze  sponges, not tissue or cotton balls. Peri-Wound Care Topical Primary Dressing Hydrofera Blue Ready Transfer Foam, 2.5x2.5 (in/in) Discharge Instruction: Apply Hydrofera Blue Ready to wound bed as directed Secondary Dressing (BORDER) Zetuvit Plus SILICONE BORDER Dressing 4x4 (in/in) Discharge Instruction: Please do not put silicone bordered dressings under wraps. Use non-bordered dressing only. Secured With Compression Wrap Compression Stockings Add-Ons Electronic Signature(s) Signed: 11/01/2022 4:09:56 PM By: Angelina Pih Entered By: Angelina Pih on 11/01/2022 09:08:32 [name] [name] ([id number]) [id number] Page 11 of 12 -------------------------------------------------------------------------------- Wound Assessment Details Patient Name: Date of Service: [name] [name], [place] 11/01/2022 8:15 A M Medical Record Number: [id number] Patient Account Number: [id number] Date of Birth/Sex: Treating RN: Jan 09, 1970 (2 [name] Primary Care Sherrika Weakland: Einar Crow Other Clinician: Referring Elwyn Lowden: Treating Vincentina Sollers/Extender: Baxter Kail Weeks in Treatment: 0 Wound Status Wound Number: 3 Primary Etiology: Diabetic Wound/Ulcer of the Lower Extremity Wound Location: Left, Lateral Lower Leg Wound Status: Open Wounding Event: Gradually Appeared Comorbid History: Hypertension, Type II Diabetes, Gout Date Acquired: 08/19/2022 Weeks Of Treatment: 0 Clustered Wound: No Photos Wound Measurements Length: (cm) 1.5 Width: (cm) 1.2 Depth: (cm) 0.2 Area: (cm) 1.414 Volume: (cm) 0.283 % Reduction in Area: % Reduction in Volume: Epithelialization: None Tunneling: No Undermining: No Wound Description Classification: Grade 1 Exudate Amount: Medium Exudate Type: Serosanguineous Exudate Color: red, brown Foul Odor After Cleansing: No Slough/Fibrino Yes Wound Bed Granulation Amount: Small (1-33%) Exposed  Structure Granulation Quality: Red Fat Layer (Subcutaneous Tissue) Exposed: Yes Necrotic Amount: Large (67-100%) Necrotic Quality: Adherent Slough Treatment Notes Wound #3 (Lower Leg) Wound Laterality: Left, Lateral Cleanser Byram Ancillary Kit - 15 Day Supply Discharge Instruction: Use supplies as instructed; Kit contains: (15) Saline Bullets; (15) 3x3 Gauze; 15 pr Gloves Soap and Water Discharge Instruction: Gently cleanse wound with antibacterial soap, rinse and pat dry prior to dressing wounds Wound Cleanser Discharge Instruction: Wash your hands with soap and water. Remove old dressing, discard into plastic bag and place into trash. Cleanse the [name] [name], [name] [name] ([id number]) [id number] Page 12 of 12 wound with Wound Cleanser prior to applying a clean dressing using gauze sponges, not tissues or cotton balls. Do not scrub or use excessive force. Pat dry using gauze sponges, not tissue or cotton balls. Peri-Wound Care Topical Primary Dressing Hydrofera Blue Ready Transfer Foam, 2.5x2.5 (in/in) Discharge Instruction: Apply Hydrofera Blue Ready to wound bed as directed Secondary Dressing (BORDER) Zetuvit Plus SILICONE BORDER Dressing 4x4 (in/in) Discharge Instruction: Please do not put  silicone bordered dressings under wraps. Use non-bordered dressing only. Secured With Compression Wrap Compression Stockings Add-Ons Electronic Signature(s) Signed: 11/01/2022 4:09:56 PM By: Angelina Pih Entered By: Angelina Pih on 11/01/2022 09:09:19 -------------------------------------------------------------------------------- Vitals Details Patient Name: Date of Service: [name] [place] [name], [place] 11/01/2022 8:15 A M Medical Record Number: [id number] Patient Account Number: [id number] Date of Birth/Sex: Treating RN: [birth year] (45 [name] Primary Care Ellena Kamen: Einar Crow Other Clinician: Referring Aashi Derrington: Treating Cordelro Gautreau/Extender:  Baxter Kail Weeks in Treatment: 0 Vital Signs Time Taken: 08:38 Temperature (F): 98.1 Height (in): 74 Pulse (bpm): 84 Source: Stated Respiratory Rate (breaths/min): 20 Weight (lbs): 546 Blood Pressure (mmHg): 170/94 Source: Stated Reference Range: 80 - 120 mg / dl Body Mass Index (BMI): 70.1 Electronic Signature(s) Signed: 11/01/2022 4:09:56 PM By: Angelina Pih Entered By: Angelina Pih on 11/01/2022 08:41:47

## 2022-11-01 NOTE — Progress Notes (Signed)
[name], [name] ([id number]) [id number] Page 1 of 5 Visit Report for 11/01/2022 Abuse Risk Screen Details Patient Name: Date of Service: [name] [name], [place] 11/01/2022 8:15 A M Medical Record Number: [id number] Patient Account Number: [id number] Date of Birth/Sex: Treating RN: 03-17-1970 (52 y.o. Laymond Purser Primary Care Chaden Doom: Einar Crow Other Clinician: Referring Roseana Rhine: Treating Scotty Pinder/Extender: Florentina Jenny in Treatment: 0 Abuse Risk Screen Items Answer ABUSE RISK SCREEN: Has anyone close to you tried to hurt or harm you recentlyo No Do you feel uncomfortable with anyone in your familyo No Has anyone forced you do things that you didnt want to doo No Electronic Signature(s) Signed: 11/01/2022 4:09:56 PM By: Angelina Pih Entered By: Angelina Pih on 11/01/2022 08:45:02 -------------------------------------------------------------------------------- Activities of Daily Living Details Patient Name: Date of Service: [name] [name] [place] 11/01/2022 8:15 A M Medical Record Number: [id number] Patient Account Number: [id number] Date of Birth/Sex: Treating RN: 1970/11/04 (52 y.o. Laymond Purser Primary Care [name] [name]: Einar Crow Other Clinician: Referring [name] [name]: Treating [name] [name]/Extender: [name] [name] in Treatment: 0 Activities of Daily Living Items Answer Activities of Daily Living (Please select one for each item) Drive Automobile Completely Able T Medications ake Completely Able Use T elephone Completely Able Care for Appearance Completely Able Use T oilet Completely Able Bath / Shower Completely Able Dress Self Completely Able Feed Self Completely Able Walk Completely Able Get In / Out Bed Completely Able Housework Completely HINCKLEY, MORENA ([id number]) [id number] Page 2 of 5 Prepare Meals Completely Able Handle  Money Completely Able Shop for Self Completely Able Electronic Signature(s) Signed: 11/01/2022 4:09:56 PM By: Angelina Pih Entered By: Angelina Pih on 11/01/2022 08:45:25 -------------------------------------------------------------------------------- Education Screening Details Patient Name: Date of Service: [name] [name], [place] 11/01/2022 8:15 A M Medical Record Number: [id number] Patient Account Number: [id number] Date of Birth/Sex: Treating RN: 05/30/1970 (52 y.o. Laymond Purser Primary Care Ameliyah Sarno: Einar Crow Other Clinician: Referring Kalya Troeger: Treating [name]/Extender: Florentina Jenny in Treatment: 0 Primary Learner Assessed: Patient Learning Preferences/Education Level/Primary Language Learning Preference: Explanation, Demonstration, Video, Communication Board, Printed Material Preferred Language: English Cognitive Barrier Language Barrier: No Translator Needed: No Memory Deficit: No Emotional Barrier: No Cultural/Religious Beliefs Affecting Medical Care: No Physical Barrier Impaired Vision: Yes Glasses Impaired Hearing: No Decreased Hand dexterity: No Knowledge/Comprehension Knowledge Level: High Comprehension Level: High Ability to understand written instructions: High Ability to understand verbal instructions: High Motivation Anxiety Level: Calm Cooperation: Cooperative Education Importance: Acknowledges Need Interest in Health Problems: Asks Questions Perception: Coherent Willingness to Engage in Self-Management High Activities: Readiness to Engage in Self-Management High Activities: Electronic Signature(s) Signed: 11/01/2022 4:09:56 PM By: Angelina Pih Entered By: Angelina Pih on 11/01/2022 09:15:53 Mellody Life ([id number]) [id number] Page 3 of 5 -------------------------------------------------------------------------------- Fall Risk Assessment Details Patient Name: Date  of Service: [name] [name],  [place] 11/01/2022 8:15 A M Medical Record Number: [id number] Patient Account Number: [id number] Date of Birth/Sex: Treating RN: 07/23/1970 (52 y.o. Laymond Purser Primary Care [name] [name]: Einar Crow Other Clinician: Referring Evaristo Tsuda: Treating [name] [name]/Extender: Florentina Jenny in Treatment: 0 Fall Risk Assessment Items Have you had 2 or more falls in the last 12 monthso 0 No Have you had any fall that resulted in injury in the last 12 monthso 0 No FALLS RISK SCREEN History of falling - immediate or within 3 months 0 No Secondary diagnosis (Do you have 2 or more medical  diagnoseso) 0 No Ambulatory aid None/bed rest/wheelchair/nurse 0 Yes Crutches/cane/walker 0 No Furniture 0 No Intravenous therapy Access/Saline/Heparin Lock 0 No Gait/Transferring Normal/ bed rest/ wheelchair 0 Yes Weak (short steps with or without shuffle, stooped but able to lift head while walking, may seek 0 No support from furniture) Impaired (short steps with shuffle, may have difficulty arising from chair, head down, impaired 0 No balance) Mental Status Oriented to own ability 0 Yes Electronic Signature(s) Signed: 11/01/2022 4:09:56 PM By: Angelina Pih Entered By: Angelina Pih on 11/01/2022 09:15:17 -------------------------------------------------------------------------------- Foot Assessment Details Patient Name: Date of Service: [name] [name], [place] 11/01/2022 8:15 A M Medical Record Number: [id number] Patient Account Number: [id number] Date of Birth/Sex: Treating RN: Aug 14, 1970 (52 y.o. Laymond Purser Primary Care Cranston Koors: Einar Crow Other Clinician: Referring [name] [name]: Treating [name] [name]/Extender: [name] [name] in Treatment: 0 Foot Assessment Items Site Locations [place], [place] ([id number]) [id number] Page 4 of 5 + = Sensation present, - = Sensation absent, C = Callus, U =  Ulcer R = Redness, W = Warmth, M = Maceration, PU = Pre-ulcerative lesion F = Fissure, S = Swelling, D = Dryness Assessment Right: Left: Other Deformity: No No Prior Foot Ulcer: No No Prior Amputation: No No Charcot Joint: No No Ambulatory Status: Ambulatory Without Help Gait: Steady Electronic Signature(s) Signed: 11/01/2022 4:09:56 PM By: Angelina Pih Entered By: Angelina Pih on 11/01/2022 09:14:47 -------------------------------------------------------------------------------- Nutrition Risk Screening Details Patient Name: Date of Service: [name] [name], [place] 11/01/2022 8:15 A M Medical Record Number: [id number] Patient Account Number: [id number] Date of Birth/Sex: Treating RN: [birth year] (52 y.o. Laymond Purser Primary Care Man Bonneau: Einar Crow Other Clinician: Referring Nestor Wieneke: Treating Nadiah Corbit/Extender: [name] [name] in Treatment: 0 Height (in): 74 Weight (lbs): 546 Body Mass Index (BMI): 70.1 Nutrition Risk Screening Items Score Screening NUTRITION RISK SCREEN: I have an illness or condition that made me change the kind and/or amount of food I eat 0 No I eat fewer than two meals per day 0 No I eat few fruits and vegetables, or milk products 0 No I have three or more drinks of beer, liquor or wine almost every day 0 No I have tooth or mouth problems that make it hard for me to eat 0 No I don't always have enough money to buy the food I need 0 No Aliano, Gwynn ([id number]) [id number] Page 5 of 5 I eat alone most of the time 0 No I take three or more different prescribed or over-the-counter drugs a day 0 No Without wanting to, I have lost or gained 10 pounds in the last six months 0 No I am not always physically able to shop, cook and/or feed myself 0 No Nutrition Protocols Good Risk Protocol 0 No interventions needed Moderate Risk Protocol High Risk Proctocol Risk Level: Good Risk Score:  0 Electronic Signature(s) Signed: 11/01/2022 4:09:56 PM By: Angelina Pih Entered By: Angelina Pih on 11/01/2022 09:15:05

## 2022-11-01 NOTE — Progress Notes (Signed)
KREW, AMADIO ([id number]) [id number] Page 1 of 11 Visit Report for 11/01/2022 Chief Complaint Document Details Patient Name: Date of Service: [name] [name], [name] [name] [place] 11/01/2022 8:15 A M Medical Record Number: [id number] Patient Account Number: [id number] Date of Birth/Sex: Treating RN: January 18, 1970 (52 y.o. [name] [name] Primary Care Provider: Einar Crow Other Clinician: Referring Provider: Treating Provider/Extender: Baxter Kail Weeks in Treatment: 0 Information Obtained from: Patient Chief Complaint Left foot and Left LE Ulcers Electronic Signature(s) Signed: 11/01/2022 9:12:44 AM By: Allen Derry PA-C Entered By: Allen Derry on 11/01/2022 09:12:44 -------------------------------------------------------------------------------- Debridement Details Patient Name: Date of Service: [name] LLO [place] Y, [place] 11/01/2022 8:15 A M Medical Record Number: [id number] Patient Account Number: [id number] Date of Birth/Sex: Treating RN: [birth year] (52 y.o. [name] [name] Primary Care Provider: Einar Crow Other Clinician: Referring Provider: Treating Provider/Extender: Baxter Kail Weeks in Treatment: 0 Debridement Performed for Assessment: Wound #1 Left,Plantar Foot Performed By: Physician Allen Derry, PA-C Debridement Type: Debridement Severity of Tissue Pre Debridement: Fat layer exposed Level of Consciousness (Pre-procedure): Awake and Alert Pre-procedure Verification/Time Out Yes - 09:17 Taken: Pain Control: Lidocaine 4% T opical Solution Percent of Wound Bed Debrided: 100% T Area Debrided (cm): otal 0.75 Tissue and other material debrided: Viable, Non-Viable, Callus, Slough, Subcutaneous, Slough Level: Skin/Subcutaneous Tissue Debridement Description: Excisional Instrument: Curette Bleeding: Moderate Hemostasis Achieved: Pressure Response to Treatment: Procedure was tolerated well Level of Consciousness  (Post- Awake and Alert procedure): ALVI, STUTEVILLE ([id number]) [id number] Page 2 of 11 Post Debridement Measurements of Total Wound Length: (cm) 0.8 Width: (cm) 1.2 Depth: (cm) 0.3 Volume: (cm) 0.226 Character of Wound/Ulcer Post Debridement: Stable Severity of Tissue Post Debridement: Fat layer exposed Post Procedure Diagnosis Same as Pre-procedure Electronic Signature(s) Signed: 11/01/2022 2:19:13 PM By: Allen Derry PA-C Signed: 11/01/2022 4:09:56 PM By: Angelina Pih Entered By: Angelina Pih on 11/01/2022 09:21:54 -------------------------------------------------------------------------------- HPI Details Patient Name: Date of Service: [name] [name], [place] 11/01/2022 8:15 A M Medical Record Number: [id number] Patient Account Number: [id number] Date of Birth/Sex: Treating RN: [birth year] (52 y.o. [name] [name] Primary Care Provider: Einar Crow Other Clinician: Referring Provider: Treating Provider/Extender: Baxter Kail Weeks in Treatment: 0 History of Present Illness Chronic/Inactive Conditions Condition 1: 11-01-2022 on evaluation today patient's ABIs were unfortunately noncompressible bilaterally though he has good pulses 1+ equal bilaterally when I palpated and the reason I could not feel them better it appears as because of the fact that he is having a lot of issues with swelling and again due to his size in general. Nonetheless his capillary refill was actually less than a second and it filled really quickly after testing and I feel like that his extremities were nice and warm he had hair growth as well in the lower extremities I do not think he needs to go for formal evaluation at this point. HPI Description: 11-01-2022 upon evaluation today patient's wound bed actually showed signs of having multiple wounds over the left lower extremity 2 in fact on the leg 1 on the plantar foot. He tells me he does not really know  how long the foot is been present although he states his wife noted it 2 weeks ago. Obviously looking at this wound I think this is much longer than 2 weeks although he does not have feeling he is a diabetic type II with diabetic neuropathy and this subsequently means that he does not really know exactly when this occurred. He is not  having any pain obviously at this point in the leg ulcers he tells me started out as blisters and again the foot he really does not know how that started. He does have a significant amount of callus buildup in the foot region. Patient does have a history as noted above diabetes mellitus type 2 with a hemoglobin A1c of 7.8 that was on October 25, 2022. He also has a history of hypertension and obesity but no other major medical problems that would particularly contribute to this. Electronic Signature(s) Signed: 11/01/2022 1:41:44 PM By: Allen Derry PA-C Previous Signature: 11/01/2022 10:11:54 AM Version By: Allen Derry PA-C Entered By: Allen Derry on 11/01/2022 13:41:44 [name] [name] ([id number]) [id number] Page 3 of 11 -------------------------------------------------------------------------------- Physical Exam Details Patient Name: Date of Service: [name] [name], [name] [name] [place] 11/01/2022 8:15 A M Medical Record Number: [id number] Patient Account Number: [id number] Date of Birth/Sex: Treating RN: Mar 25, 1970 (52 y.o. [name] [name] Primary Care Provider: Einar Crow Other Clinician: Referring Provider: Treating Provider/Extender: Baxter Kail Weeks in Treatment: 0 Constitutional patient is hypertensive.. pulse regular and within target range for patient.Marland Kitchen respirations regular, non-labored and within target range for patient.Marland Kitchen temperature within target range for patient.. Obese and well-hydrated in no acute distress. Eyes conjunctiva clear no eyelid edema noted. pupils equal round and reactive to light and  accommodation. Ears, Nose, Mouth, and Throat no gross abnormality of ear auricles or external auditory canals. normal hearing noted during conversation. mucus membranes moist. Respiratory normal breathing without difficulty. Cardiovascular 1+ dorsalis pedis/posterior tibialis pulses. 1+ pitting edema of the bilateral lower extremities. Musculoskeletal normal gait and posture. no significant deformity or arthritic changes, no loss or range of motion, no clubbing. Psychiatric this patient is able to make decisions and demonstrates good insight into disease process. Alert and Oriented x 3. pleasant and cooperative. Notes Upon inspection patient's wound bed actually showed signs of pretty good granulation in regard to the wounds on the legs and very pleased in that regard. In regard to the wounds on the foot or rather the wound I did have to perform some treatment here to clear away callus as well as slough and biofilm down to good subcutaneous tissue he tolerated this today without complication and postdebridement the wound bed actually appears to be doing much better I am still concerned about a small area that goes a little bit deeper in the center part of the wound again I am unsure exactly if this is getting up open not much deeper I do not feel any bone muscle or otherwise noted at this point. Electronic Signature(s) Signed: 11/01/2022 1:42:44 PM By: Allen Derry PA-C Entered By: Allen Derry on 11/01/2022 13:42:43 -------------------------------------------------------------------------------- Physician Orders Details Patient Name: Date of Service: [name] [name], [place] 11/01/2022 8:15 A M Medical Record Number: [id number] Patient Account Number: [id number] Date of Birth/Sex: Treating RN: 11/29/1970 (52 y.o. [name] [name] Primary Care Provider: Einar Crow Other Clinician: Referring Provider: Treating Provider/Extender: Baxter Kail Weeks in Treatment: 0 The  following information was scribed by: Angelina Pih The information was scribed for: Allen Derry Verbal / Phone Orders: No Diagnosis Coding ICD-10 Coding Code Description E11.621 Type 2 diabetes mellitus with foot ulcer L97.522 Non-pressure chronic ulcer of other part of left foot with fat layer exposed [name] [name] ([id number]) [id number] Page 4 of 11 [phone] Chronic venous hypertension (idiopathic) with ulcer and inflammation of left lower extremity E11.622 Type 2 diabetes mellitus with other skin ulcer L97.822 Non-pressure  chronic ulcer of other 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 Bathing/ Shower/ Hygiene May shower; gently cleanse wound with antibacterial soap, rinse and pat dry prior to dressing wounds No tub bath. Anesthetic (Use 'Patient Medications' Section for Anesthetic Order Entry) Lidocaine applied to wound bed Edema Control - Orders / Instructions Tubigrip single layer applied. - E Elevate, Exercise Daily and A void Standing for Long Periods of Time. Elevate leg(s) parallel to the floor when sitting. DO YOUR BEST to sleep in the bed at night. DO NOT sleep in your recliner. Long hours of sitting in a recliner leads to swelling of the legs and/or potential wounds on your backside. Off-Loading Open toe surgical shoe with peg assist. - left foot Other: - dial soap original gold recommended Wound Treatment Wound #1 - Foot Wound Laterality: Plantar, Left Cleanser: Byram Ancillary Kit - 15 Day Supply (DME) (Generic) Every Other Day/30 Days Discharge Instructions: Use supplies as instructed; Kit contains: (15) Saline Bullets; (15) 3x3 Gauze; 15 pr Gloves Cleanser: Soap and Water Every Other Day/30 Days Discharge Instructions: Gently cleanse wound with antibacterial soap, rinse and pat dry prior to dressing wounds Cleanser: Wound Cleanser Every  Other Day/30 Days Discharge Instructions: Wash your hands with soap and water. Remove old dressing, discard into plastic bag and place into trash. Cleanse the wound with Wound Cleanser prior to applying a clean dressing using gauze sponges, not tissues or cotton balls. Do not scrub or use excessive force. Pat dry using gauze sponges, not tissue or cotton balls. Prim Dressing: Hydrofera Blue Ready Transfer Foam, 2.5x2.5 (in/in) Every Other Day/30 Days ary Discharge Instructions: Apply Hydrofera Blue Ready to wound bed as directed Secondary Dressing: (BORDER) Zetuvit Plus SILICONE BORDER Dressing 4x4 (in/in) (DME) (Generic) Every Other Day/30 Days Discharge Instructions: Please do not put silicone bordered dressings under wraps. Use non-bordered dressing only. Wound #2 - Lower Leg Wound Laterality: Left, Anterior Cleanser: Byram Ancillary Kit - 15 Day Supply Every Other Day/30 Days Discharge Instructions: Use supplies as instructed; Kit contains: (15) Saline Bullets; (15) 3x3 Gauze; 15 pr Gloves Cleanser: Soap and Water Every Other Day/30 Days Discharge Instructions: Gently cleanse wound with antibacterial soap, rinse and pat dry prior to dressing wounds Cleanser: Wound Cleanser Every Other Day/30 Days Discharge Instructions: Wash your hands with soap and water. Remove old dressing, discard into plastic bag and place into trash. Cleanse the wound with Wound Cleanser prior to applying a clean dressing using gauze sponges, not tissues or cotton balls. Do not scrub or use excessive force. Pat dry using gauze sponges, not tissue or cotton balls. Prim Dressing: Hydrofera Blue Ready Transfer Foam, 2.5x2.5 (in/in) Every Other Day/30 Days ary Discharge Instructions: Apply Hydrofera Blue Ready to wound bed as directed Secondary Dressing: (BORDER) Zetuvit Plus SILICONE BORDER Dressing 4x4 (in/in) (DME) (Generic) Every Other Day/30 Days Discharge Instructions: Please do not put silicone bordered dressings  under wraps. Use non-bordered dressing only. Wound #3 - Lower Leg Wound Laterality: Left, Lateral Cleanser: Byram Ancillary Kit - 15 Day Supply Every Other Day/30 Days Discharge Instructions: Use supplies as instructed; Kit contains: (15) Saline Bullets; (15) 3x3 Gauze; 15 pr Gloves Cleanser: Soap and Water Every Other Day/30 Days Discharge Instructions: Gently cleanse wound with antibacterial soap, rinse and pat dry prior to dressing wounds Cleanser: Wound Cleanser Every Other Day/30 Days Discharge Instructions: Wash your hands with soap and water. Remove old dressing, discard into plastic bag and  place into trash. Cleanse the wound with Wound Cleanser prior to applying a clean dressing using gauze sponges, not tissues or cotton balls. Do not scrub or use excessive force. Pat dry using gauze sponges, not tissue or cotton balls. Prim Dressing: Hydrofera Blue Ready Transfer Foam, 2.5x2.5 (in/in) Every Other Day/30 Days ary Discharge Instructions: Apply Hydrofera Blue Ready to wound bed as directed [name], [name] ([id number]) [phone].pdf Page 5 of 11 Secondary Dressing: (BORDER) Zetuvit Plus SILICONE BORDER Dressing 4x4 (in/in) (DME) (Generic) Every Other Day/30 Days Discharge Instructions: Please do not put silicone bordered dressings under wraps. Use non-bordered dressing only. Radiology X-ray, foot - left plantar foot wound Electronic Signature(s) Signed: 11/01/2022 2:19:13 PM By: Allen Derry PA-C Signed: 11/01/2022 4:09:56 PM By: Angelina Pih Entered By: Angelina Pih on 11/01/2022 13:43:17 -------------------------------------------------------------------------------- Problem List Details Patient Name: Date of Service: [name] [name], [place] 11/01/2022 8:15 A M Medical Record Number: [id number] Patient Account Number: [id number] Date of Birth/Sex: Treating RN: December 01, 1970 (52 y.o. [name] [name] Primary Care Provider: Einar Crow Other  Clinician: Referring Provider: Treating Provider/Extender: Baxter Kail Weeks in Treatment: 0 Active Problems ICD-10 Encounter Code Description Active Date MDM Diagnosis E11.621 Type 2 diabetes mellitus with foot ulcer 11/01/2022 No Yes L97.522 Non-pressure chronic ulcer of other part of left foot with fat layer exposed 11/01/2022 No Yes I87.332 Chronic venous hypertension (idiopathic) with ulcer and inflammation of left 11/01/2022 No Yes lower extremity E11.622 Type 2 diabetes mellitus with other skin ulcer 11/01/2022 No Yes L97.822 Non-pressure chronic ulcer of other part of left lower leg with fat layer 11/01/2022 No Yes exposed I10 Essential (primary) hypertension 11/01/2022 No Yes Inactive Problems Resolved Problems Electronic Signature(sMICHA, [name] [name] ([id number]) [id number] Page 6 of 11 Signed: 11/01/2022 9:12:30 AM By: Allen Derry PA-C Entered By: Allen Derry on 11/01/2022 09:12:29 -------------------------------------------------------------------------------- Progress Note Details Patient Name: Date of Service: [name] LLO [place] Y, [place] 11/01/2022 8:15 A M Medical Record Number: [id number] Patient Account Number: [id number] Date of Birth/Sex: Treating RN: 1970-10-07 (52 y.o. [name] [name] Primary Care Provider: Einar Crow Other Clinician: Referring Provider: Treating Provider/Extender: Baxter Kail Weeks in Treatment: 0 Subjective Chief Complaint Information obtained from Patient Left foot and Left LE Ulcers History of Present Illness (HPI) Chronic/Inactive Condition: 11-01-2022 on evaluation today patient's ABIs were unfortunately noncompressible bilaterally though he has good pulses 1+ equal bilaterally when I palpated and the reason I could not feel them better it appears as because of the fact that he is having a lot of issues with swelling and again due to his size in general. Nonetheless his  capillary refill was actually less than a second and it filled really quickly after testing and I feel like that his extremities were nice and warm he had hair growth as well in the lower extremities I do not think he needs to go for formal evaluation at this point. 11-01-2022 upon evaluation today patient's wound bed actually showed signs of having multiple wounds over the left lower extremity 2 in fact on the leg 1 on the plantar foot. He tells me he does not really know how long the foot is been present although he states his wife noted it 2 weeks ago. Obviously looking at this wound I think this is much longer than 2 weeks although he does not have feeling he is a diabetic type II with diabetic neuropathy and this subsequently means that he does not really know exactly when this occurred. He  is not having any pain obviously at this point in the leg ulcers he tells me started out as blisters and again the foot he really does not know how that started. He does have a significant amount of callus buildup in the foot region. Patient does have a history as noted above diabetes mellitus type 2 with a hemoglobin A1c of 7.8 that was on October 25, 2022. He also has a history of hypertension and obesity but no other major medical problems that would particularly contribute to this. Patient History Information obtained from Patient, Chart. Allergies No Known Drug Allergies Social History Never smoker, Alcohol Use - Never, Drug Use - No History, Caffeine Use - Daily - coffee. Medical History Cardiovascular Patient has history of Hypertension Endocrine Patient has history of Type II Diabetes Musculoskeletal Patient has history of Gout Patient is treated with Oral Agents. Blood sugar is not tested. Review of Systems (ROS) Constitutional Symptoms (General Health) Denies complaints or symptoms of Fatigue, Fever, Chills, Marked Weight Change. Eyes Complains or has symptoms of Glasses /  Contacts. Ear/Nose/Mouth/Throat Denies complaints or symptoms of Difficult clearing ears, Sinusitis. Hematologic/Lymphatic Denies complaints or symptoms of Bleeding / Clotting Disorders, Human Immunodeficiency Virus. Respiratory Denies complaints or symptoms of Chronic or frequent coughs, Shortness of Breath. Gastrointestinal Denies complaints or symptoms of Frequent diarrhea, Nausea, Vomiting. Genitourinary Denies complaints or symptoms of Kidney failure/ Dialysis, Incontinence/dribbling. Immunological [name], [name] ([id number]) [id number] Page 7 of 11 Denies complaints or symptoms of Hives, Itching. Integumentary (Skin) Complains or has symptoms of Wounds. Neurologic Denies complaints or symptoms of Numbness/parasthesias, Focal/Weakness. Psychiatric Denies complaints or symptoms of Anxiety, Claustrophobia. Objective Constitutional patient is hypertensive.. pulse regular and within target range for patient.Marland Kitchen respirations regular, non-labored and within target range for patient.Marland Kitchen temperature within target range for patient.. Obese and well-hydrated in no acute distress. Vitals Time Taken: 8:38 AM, Height: 74 in, Source: Stated, Weight: 546 lbs, Source: Stated, BMI: 70.1, Temperature: 98.1 F, Pulse: 84 bpm, Respiratory Rate: 20 breaths/min, Blood Pressure: 170/94 mmHg. Eyes conjunctiva clear no eyelid edema noted. pupils equal round and reactive to light and accommodation. Ears, Nose, Mouth, and Throat no gross abnormality of ear auricles or external auditory canals. normal hearing noted during conversation. mucus membranes moist. Respiratory normal breathing without difficulty. Cardiovascular 1+ dorsalis pedis/posterior tibialis pulses. 1+ pitting edema of the bilateral lower extremities. Musculoskeletal normal gait and posture. no significant deformity or arthritic changes, no loss or range of motion, no clubbing. Psychiatric this patient is able  to make decisions and demonstrates good insight into disease process. Alert and Oriented x 3. pleasant and cooperative. General Notes: Upon inspection patient's wound bed actually showed signs of pretty good granulation in regard to the wounds on the legs and very pleased in that regard. In regard to the wounds on the foot or rather the wound I did have to perform some treatment here to clear away callus as well as slough and biofilm down to good subcutaneous tissue he tolerated this today without complication and postdebridement the wound bed actually appears to be doing much better I am still concerned about a small area that goes a little bit deeper in the center part of the wound again I am unsure exactly if this is getting up open not much deeper I do not feel any bone muscle or otherwise noted at this point. Integumentary (Hair, Skin) Wound #1 status is Open. Original cause of wound was Gradually Appeared. The date acquired was: 08/19/2022. The wound is located on  the Left,Plantar Foot. The wound measures 0.8cm length x 1.2cm width x 0.3cm depth; 0.754cm^2 area and 0.226cm^3 volume. There is Fat Layer (Subcutaneous Tissue) exposed. There is no tunneling noted, however, there is undermining starting at 10:00 and ending at 2:00 with a maximum distance of 0.2cm. There is a medium amount of serosanguineous drainage noted. There is medium (34-66%) red granulation within the wound bed. There is a medium (34-66%) amount of necrotic tissue within the wound bed including Adherent Slough. Wound #2 status is Open. Original cause of wound was Gradually Appeared. The date acquired was: 08/19/2022. The wound is located on the Left,Anterior Lower Leg. The wound measures 1.5cm length x 1.3cm width x 0.1cm depth; 1.532cm^2 area and 0.153cm^3 volume. There is Fat Layer (Subcutaneous Tissue) exposed. There is no tunneling or undermining noted. There is a medium amount of serosanguineous drainage noted. There is large  (67-100%) red granulation within the wound bed. There is a small (1-33%) amount of necrotic tissue within the wound bed including Adherent Slough. Wound #3 status is Open. Original cause of wound was Gradually Appeared. The date acquired was: 08/19/2022. The wound is located on the Left,Lateral Lower Leg. The wound measures 1.5cm length x 1.2cm width x 0.2cm depth; 1.414cm^2 area and 0.283cm^3 volume. There is Fat Layer (Subcutaneous Tissue) exposed. There is no tunneling or undermining noted. There is a medium amount of serosanguineous drainage noted. There is small (1-33%) red granulation within the wound bed. There is a large (67-100%) amount of necrotic tissue within the wound bed including Adherent Slough. Assessment Active Problems ICD-10 Type 2 diabetes mellitus with foot ulcer Non-pressure chronic ulcer of other part of left foot with fat layer exposed Chronic venous hypertension (idiopathic) with ulcer and inflammation of left lower extremity Type 2 diabetes mellitus with other skin ulcer Non-pressure chronic ulcer of other part of left lower leg with fat layer exposed Essential (primary) hypertension Procedures [place], [name] [name] ([id number]) [id number] Page 8 of 11 Wound #1 Pre-procedure diagnosis of Wound #1 is a Diabetic Wound/Ulcer of the Lower Extremity located on the Left,Plantar Foot .Severity of Tissue Pre Debridement is: Fat layer exposed. There was a Excisional Skin/Subcutaneous Tissue Debridement with a total area of 0.75 sq cm performed by Allen Derry, PA-C. With the following instrument(s): Curette to remove Viable and Non-Viable tissue/material. Material removed includes Callus, Subcutaneous Tissue, and Slough after achieving pain control using Lidocaine 4% T opical Solution. No specimens were taken. A time out was conducted at 09:17, prior to the start of the procedure. A Moderate amount of bleeding was controlled with Pressure. The procedure  was tolerated well. Post Debridement Measurements: 0.8cm length x 1.2cm width x 0.3cm depth; 0.226cm^3 volume. Character of Wound/Ulcer Post Debridement is stable. Severity of Tissue Post Debridement is: Fat layer exposed. Post procedure Diagnosis Wound #1: Same as Pre-Procedure Plan Follow-up Appointments: Return Appointment in 1 week. Bathing/ Shower/ Hygiene: May shower; gently cleanse wound with antibacterial soap, rinse and pat dry prior to dressing wounds No tub bath. Anesthetic (Use 'Patient Medications' Section for Anesthetic Order Entry): Lidocaine applied to wound bed Edema Control - Orders / Instructions: Tubigrip single layer applied. - E Elevate, Exercise Daily and Avoid Standing for Long Periods of Time. Elevate leg(s) parallel to the floor when sitting. DO YOUR BEST to sleep in the bed at night. DO NOT sleep in your recliner. Long hours of sitting in a recliner leads to swelling of the legs and/or potential wounds on your backside. Off-Loading: Open toe surgical shoe with  peg assist. - left foot Other: - dial soap original gold recommended Radiology ordered were: X-ray, foot - left plantar foot wound WOUND #1: - Foot Wound Laterality: Plantar, Left Cleanser: Byram Ancillary Kit - 15 Day Supply Every Other Day/15 Days Discharge Instructions: Use supplies as instructed; Kit contains: (15) Saline Bullets; (15) 3x3 Gauze; 15 pr Gloves Cleanser: Soap and Water Every Other Day/15 Days Discharge Instructions: Gently cleanse wound with antibacterial soap, rinse and pat dry prior to dressing wounds Cleanser: Wound Cleanser Every Other Day/15 Days Discharge Instructions: Wash your hands with soap and water. Remove old dressing, discard into plastic bag and place into trash. Cleanse the wound with Wound Cleanser prior to applying a clean dressing using gauze sponges, not tissues or cotton balls. Do not scrub or use excessive force. Pat dry using gauze sponges, not tissue or cotton  balls. Prim Dressing: Hydrofera Blue Ready Transfer Foam, 2.5x2.5 (in/in) Every Other Day/15 Days ary Discharge Instructions: Apply Hydrofera Blue Ready to wound bed as directed Secondary Dressing: (BORDER) Zetuvit Plus SILICONE BORDER Dressing 4x4 (in/in) Every Other Day/15 Days Discharge Instructions: Please do not put silicone bordered dressings under wraps. Use non-bordered dressing only. WOUND #2: - Lower Leg Wound Laterality: Left, Anterior Cleanser: Byram Ancillary Kit - 15 Day Supply Every Other Day/15 Days Discharge Instructions: Use supplies as instructed; Kit contains: (15) Saline Bullets; (15) 3x3 Gauze; 15 pr Gloves Cleanser: Soap and Water Every Other Day/15 Days Discharge Instructions: Gently cleanse wound with antibacterial soap, rinse and pat dry prior to dressing wounds Cleanser: Wound Cleanser Every Other Day/15 Days Discharge Instructions: Wash your hands with soap and water. Remove old dressing, discard into plastic bag and place into trash. Cleanse the wound with Wound Cleanser prior to applying a clean dressing using gauze sponges, not tissues or cotton balls. Do not scrub or use excessive force. Pat dry using gauze sponges, not tissue or cotton balls. Prim Dressing: Hydrofera Blue Ready Transfer Foam, 2.5x2.5 (in/in) Every Other Day/15 Days ary Discharge Instructions: Apply Hydrofera Blue Ready to wound bed as directed Secondary Dressing: (BORDER) Zetuvit Plus SILICONE BORDER Dressing 4x4 (in/in) Every Other Day/15 Days Discharge Instructions: Please do not put silicone bordered dressings under wraps. Use non-bordered dressing only. WOUND #3: - Lower Leg Wound Laterality: Left, Lateral Cleanser: Byram Ancillary Kit - 15 Day Supply Every Other Day/15 Days Discharge Instructions: Use supplies as instructed; Kit contains: (15) Saline Bullets; (15) 3x3 Gauze; 15 pr Gloves Cleanser: Soap and Water Every Other Day/15 Days Discharge Instructions: Gently cleanse wound with  antibacterial soap, rinse and pat dry prior to dressing wounds Cleanser: Wound Cleanser Every Other Day/15 Days Discharge Instructions: Wash your hands with soap and water. Remove old dressing, discard into plastic bag and place into trash. Cleanse the wound with Wound Cleanser prior to applying a clean dressing using gauze sponges, not tissues or cotton balls. Do not scrub or use excessive force. Pat dry using gauze sponges, not tissue or cotton balls. Prim Dressing: Hydrofera Blue Ready Transfer Foam, 2.5x2.5 (in/in) Every Other Day/15 Days ary Discharge Instructions: Apply Hydrofera Blue Ready to wound bed as directed Secondary Dressing: (BORDER) Zetuvit Plus SILICONE BORDER Dressing 4x4 (in/in) Every Other Day/15 Days Discharge Instructions: Please do not put silicone bordered dressings under wraps. Use non-bordered dressing only. 1. I would recommend based on what we are seeing at this point that we have the patient go ahead and initiate treatment with Hydrofera Blue to all wound locations I think this is gena be a  good way to go. 2. I am going to recommend a peg assist shoe for the left foot. 3. I am also going to recommend that the patient should actually utilize the Tubigrip size E single-layer for the time being to try to help with edema control. Will see how things look over the next week. We will see patient back for reevaluation in 1 week here in the clinic. If anything worsens or changes patient will contact our office for additional recommendations. Electronic Signature(s) [name] [name], [name] [name] ([id number]) [id number] Page 9 of 11 Signed: 11/01/2022 1:43:21 PM By: Allen Derry PA-C Entered By: Allen Derry on 11/01/2022 13:43:21 -------------------------------------------------------------------------------- ROS/PFSH Details Patient Name: Date of Service: [name] LLO [place], [place] 11/01/2022 8:15 A M Medical Record Number: [id number] Patient Account Number:  [id number] Date of Birth/Sex: Treating RN: November 29, 1970 (52 y.o. [name] [name] Primary Care Provider: Einar Crow Other Clinician: Referring Provider: Treating Provider/Extender: Florentina Jenny in Treatment: 0 Information Obtained From Patient Chart Constitutional Symptoms (General Health) Complaints and Symptoms: Negative for: Fatigue; Fever; Chills; Marked Weight Change Eyes Complaints and Symptoms: Positive for: Glasses / Contacts Ear/Nose/Mouth/Throat Complaints and Symptoms: Negative for: Difficult clearing ears; Sinusitis Hematologic/Lymphatic Complaints and Symptoms: Negative for: Bleeding / Clotting Disorders; Human Immunodeficiency Virus Respiratory Complaints and Symptoms: Negative for: Chronic or frequent coughs; Shortness of Breath Gastrointestinal Complaints and Symptoms: Negative for: Frequent diarrhea; Nausea; Vomiting Genitourinary Complaints and Symptoms: Negative for: Kidney failure/ Dialysis; Incontinence/dribbling Immunological Complaints and Symptoms: Negative for: Hives; Itching Integumentary (Skin) Complaints and Symptoms: Positive for: Wounds Neurologic Complaints and Symptoms: Negative for: Numbness/parasthesias; Focal/Weakness [place], [name] [name] ([id number]) [id number] Page 10 of 11 Psychiatric Complaints and Symptoms: Negative for: Anxiety; Claustrophobia Cardiovascular Medical History: Positive for: Hypertension Endocrine Medical History: Positive for: Type II Diabetes Time with diabetes: 3-4 years Treated with: Oral agents Blood sugar tested every day: No Musculoskeletal Medical History: Positive for: Gout Oncologic Immunizations Pneumococcal Vaccine: Received Pneumococcal Vaccination: No Implantable Devices None Family and Social History Never smoker; Alcohol Use: Never; Drug Use: No History; Caffeine Use: Daily - coffee Electronic Signature(s) Signed: 11/01/2022 2:19:13 PM  By: Allen Derry PA-C Signed: 11/01/2022 4:09:56 PM By: Angelina Pih Entered By: Angelina Pih on 11/01/2022 08:44:56 -------------------------------------------------------------------------------- SuperBill Details Patient Name: Date of Service: [name] L[place], [place] 11/01/2022 Medical Record Number: [id number] Patient Account Number: [id number] Date of Birth/Sex: Treating RN: Feb 19, 1970 (52 y.o. [name] [name] Primary Care Provider: Einar Crow Other Clinician: Referring Provider: Treating Provider/Extender: Baxter Kail Weeks in Treatment: 0 Diagnosis Coding ICD-10 Codes Code Description [phone] Type 2 diabetes mellitus with foot ulcer L97.522 Non-pressure chronic ulcer of other part of left foot with fat layer exposed I87.332 Chronic venous hypertension (idiopathic) with ulcer and inflammation of left lower extremity [place], [name] [name] ([id number]) [id number] Page 11 of 11 E11.622 Type 2 diabetes mellitus with other skin ulcer L97.822 Non-pressure chronic ulcer of other part of left lower leg with fat layer exposed I10 Essential (primary) hypertension Facility Procedures : CPT4 Code: 63016010 Description: 99213 - WOUND CARE VISIT-LEV 3 EST PT Modifier: Quantity: 1 : CPT4 Code: 93235573 Description: 11042 - DEB SUBQ TISSUE 20 SQ CM/< ICD-10 Diagnosis Description L97.522 Non-pressure chronic ulcer of other part of left foot with fat layer exposed Modifier: Quantity: 1 : CPT4 Code: 22025427 Description: HC SHOE DARCO SHOE Modifier: Quantity: 1 Physician Procedures : CPT4 Code Description Modifier [id number] WC PHYS LEVEL 3 NEW PT 25 ICD-10 Diagnosis Description E11.621 Type 2 diabetes mellitus with foot ulcer  N82.956 Non-pressure chronic ulcer of other part of left foot with fat layer exposed I87.332 Chronic venous  hypertension (idiopathic) with ulcer and inflammation of left lower extremity E11.622 Type 2 diabetes mellitus  with other skin ulcer Quantity: 1 : [id number] 11042 - WC PHYS SUBQ TISS 20 SQ CM ICD-10 Diagnosis Description L97.522 Non-pressure chronic ulcer of other part of left foot with fat layer exposed Quantity: 1 Electronic Signature(s) Signed: 11/01/2022 1:47:54 PM By: Angelina Pih Signed: 11/01/2022 2:19:13 PM By: Allen Derry PA-C Previous Signature: 11/01/2022 1:44:21 PM Version By: Angelina Pih Previous Signature: 11/01/2022 1:43:51 PM Version By: Allen Derry PA-C Entered By: Angelina Pih on 11/01/2022 13:47:54

## 2022-11-08 NOTE — Progress Notes (Signed)
   No chief complaint on file.   Subjective:  52 y.o. male with PMHx of diabetes mellitus, last A1c on 05/20/2021 was 7.5, morbid obesity, presenting today for evaluation of multiple ulcers that developed to the patient's left lower extremity. Idiopathic onset.  Denies a history of injury.  He has been applying Neosporin and a Band-Aid over the areas.  states that they have been present for a few weeks   Past Medical History:  Diagnosis Date   DM (diabetes mellitus) (HCC)    Gout    HTN (hypertension)    Obesity     No past surgical history on file.  No Known Allergies   LT foot 10/22/2022  LT leg 10/22/2022  LT leg 10/22/2022   Objective/Physical Exam General: The patient is alert and oriented x3 in no acute distress.  Dermatology:  Ulcer noted to the anterior aspect of the left leg as well as the lateral aspect of the left leg.  Each measuring approximately 1.5 x 1.5 x 0.2 cm with exception that the lateral ulcer is deeper extending approximately 0.5 cm into the deeper tissues and subcutaneous tissue of the leg  Ulcer also noted to the plantar aspect of the fifth MTP of the left foot with thick callus formation around the area measuring approximately 1.0 x 1.0 x 0.2 cm.  Currently there is no malodor.  No purulence.  The ulcers do appear very stable although there is some warmth to the leg  Vascular: Skin is warm to touch.  Capillary refill WNL.  Clinically there is no concern for arterial compromise.  There is some hemosiderin discoloration noted to the ankles concerning for chronic venous insufficiency.  Neurological: Light touch and protective threshold diminished bilaterally.   Musculoskeletal Exam: Range of motion within normal limits to all pedal and ankle joints bilateral.  Patient ambulatory.  No prior amputations.  Assessment: 1.  Ulcer left lower extremity secondary to diabetes mellitus 2. diabetes mellitus w/ peripheral neuropathy   Plan of Care:  -Patient  was evaluated. -Medically necessary excisional debridement including subcutaneous tissue was performed using a tissue nipper and a chisel blade. Excisional debridement of all the necrotic nonviable tissue down to healthy bleeding viable tissue was performed with post-debridement measurements same as pre-. -For now continue triple antibiotic ointment and a light dressing to the wounds -Prescription for doxycycline 100 mg BID x 10 days -Due to the multiple ulcers extending throughout the leg I do believe the patient would benefit from evaluation at the wound care center.  Referral placed -Return to clinic with me as needed   Felecia Shelling, DPM [facility]  Dr. Felecia Shelling, DPM    [address] [place], [place] [address]                Office [phone]  Fax [phone]

## 2022-11-09 ENCOUNTER — Ambulatory Visit
Admission: RE | Admit: 2022-11-09 | Discharge: 2022-11-09 | Disposition: A | Discharge status: Home / Self Care | Payer: BC Managed Care – PPO | Attending: Physician Assistant | Admitting: Physician Assistant

## 2022-11-09 ENCOUNTER — Encounter: Payer: BC Managed Care – PPO | Attending: Physician Assistant | Admitting: Physician Assistant

## 2022-11-09 ENCOUNTER — Ambulatory Visit
Admission: RE | Admit: 2022-11-09 | Discharge: 2022-11-09 | Disposition: A | Discharge status: Home / Self Care | Payer: BC Managed Care – PPO | Source: Ambulatory Visit | Attending: Physician Assistant

## 2022-11-09 DIAGNOSIS — Z6841 Body Mass Index (BMI) 40.0 and over, adult: Secondary | ICD-10-CM | POA: Insufficient documentation

## 2022-11-09 DIAGNOSIS — E11621 Type 2 diabetes mellitus with foot ulcer: Secondary | ICD-10-CM | POA: Insufficient documentation

## 2022-11-09 DIAGNOSIS — I87332 Chronic venous hypertension (idiopathic) with ulcer and inflammation of left lower extremity: Secondary | ICD-10-CM | POA: Insufficient documentation

## 2022-11-09 DIAGNOSIS — I10 Essential (primary) hypertension: Secondary | ICD-10-CM | POA: Diagnosis not present

## 2022-11-09 DIAGNOSIS — E11622 Type 2 diabetes mellitus with other skin ulcer: Secondary | ICD-10-CM | POA: Insufficient documentation

## 2022-11-09 DIAGNOSIS — M79672 Pain in left foot: Secondary | ICD-10-CM | POA: Diagnosis present

## 2022-11-09 DIAGNOSIS — L97522 Non-pressure chronic ulcer of other part of left foot with fat layer exposed: Secondary | ICD-10-CM | POA: Insufficient documentation

## 2022-11-09 DIAGNOSIS — L97822 Non-pressure chronic ulcer of other part of left lower leg with fat layer exposed: Secondary | ICD-10-CM | POA: Insufficient documentation

## 2022-11-09 NOTE — Progress Notes (Signed)
BASILIO, [name] ([id number]) [id number] Page 1 of 8 Visit Report for 11/09/2022 Chief Complaint Document Details Patient Name: Date of Service: [name] [name], [name] [name] [name] [place] 11/09/2022 1:00 PM Medical Record Number: [id number] Patient Account Number: [id number] Date of Birth/Sex: Treating RN: 08-21-1970 (52 y.o. [name] [name] Primary Care Provider: Einar Crow Other Clinician: Referring Provider: Treating Provider/Extender: Johny Shears in Treatment: 1 Information Obtained from: Patient Chief Complaint Left foot and Left LE Ulcers Electronic Signature(s) Signed: 11/09/2022 1:14:08 PM By: Allen Derry PA-C Entered By: Allen Derry on 11/09/2022 13:14:08 -------------------------------------------------------------------------------- HPI Details Patient Name: Date of Service: [name] [name], [name] [name] [place] 11/09/2022 1:00 PM Medical Record Number: [id number] Patient Account Number: [id number] Date of Birth/Sex: Treating RN: 28-Jan-1970 (52 y.o. [name] [name] Primary Care Provider: Einar Crow Other Clinician: Referring Provider: Treating Provider/Extender: Johny Shears in Treatment: 1 History of Present Illness Chronic/Inactive Conditions Condition 1: 11-01-2022 on evaluation today patient's ABIs were unfortunately noncompressible bilaterally though he has good pulses 1+ equal bilaterally when I palpated and the reason I could not feel them better it appears as because of the fact that he is having a lot of issues with swelling and again due to his size in general. Nonetheless his capillary refill was actually less than a second and it filled really quickly after testing and I feel like that his extremities were nice and warm he had hair growth as well in the lower extremities I do not think he needs to go for formal evaluation at this point. HPI Description: 11-01-2022 upon evaluation today patient's wound  bed actually showed signs of having multiple wounds over the left lower extremity 2 in fact on the leg 1 on the plantar foot. He tells me he does not really know how long the foot is been present although he states his wife noted it 2 [name] [name] ago. Obviously looking at this wound I think this is much longer than 2 [name] [name] although he does not have feeling he is a diabetic type II with diabetic neuropathy and this subsequently means that he does not really know exactly when this occurred. He is not having any pain obviously at this point in the leg ulcers he tells me started out as blisters and again the foot he really does not know how that started. He does have a significant amount of callus buildup in the foot region. Patient does have a history as noted above diabetes mellitus type 2 with a hemoglobin A1c of 7.8 that was on October 25, 2022. He also has a history of hypertension and obesity but no other major medical problems that would particularly contribute to this. Well currently in regard to 11-09-2022 upon evaluation today patient appears to be doing his wounds as far as being stable for the most part although the left anterior leg actually showed signs of cellulitis this is not doing as well as I like to see it. Has not been using Tubigrip quite right. I think we need to try to get this infection under control before all else. He voiced understanding. With that being said I do think that at this point we are going to mark where the erythema [name] [name], [name] [name] ([id number]) [id number] Page 2 of 8 redness is visually and tactile we are going to monitor and make sure that this does not spread if it does he needs to go to the ER soon as possible for now I am going to send in a  prescription for Levaquin for him which I think is gena be the best way to go and he is in agreement with that plan. Electronic Signature(s) Signed: 11/09/2022 2:21:55 PM By: Allen Derry PA-C Entered By:  Allen Derry on 11/09/2022 14:21:54 -------------------------------------------------------------------------------- Physical Exam Details Patient Name: Date of Service: [name] [name], [name] 11/09/2022 1:00 PM Medical Record Number: [id number] Patient Account Number: [id number] Date of Birth/Sex: Treating RN: 06/15/1970 (52 y.o. [name] [name] Primary Care Provider: Einar Crow Other Clinician: Referring Provider: Treating Provider/Extender: Johny Shears in Treatment: 1 Constitutional Obese and well-hydrated in no acute distress. Respiratory normal breathing without difficulty. Psychiatric this patient is able to make decisions and demonstrates good insight into disease process. Alert and Oriented x 3. pleasant and cooperative. Notes Upon inspection patient's wound bed actually showed signs at all locations of doing okay I do not see any signs of infection systemically though locally I do see signs of cellulitis in regard to the leg in general. There is erythema and warmth. I am going to go ahead and recommend that we obtain a PCR culture and send a send he also needs to get the x-ray of the foot I think that still to be of utmost importance. Electronic Signature(s) Signed: 11/09/2022 2:45:55 PM By: Allen Derry PA-C Entered By: Allen Derry on 11/09/2022 14:45:55 -------------------------------------------------------------------------------- Physician Orders Details Patient Name: Date of Service: [name] [name], [name] [name] [place] 11/09/2022 1:00 PM Medical Record Number: [id number] Patient Account Number: [id number] Date of Birth/Sex: Treating RN: 1970-04-05 (52 y.o. [name] [name] Primary Care Provider: Einar Crow Other Clinician: Referring Provider: Treating Provider/Extender: Johny Shears in Treatment: 1 The following information was scribed by: Angelina Pih The information was scribed for: Allen Derry Verbal / Phone  Orders: No [name] [name], [name] [name] ([id number]) [id number] Page 3 of 8 Diagnosis Coding ICD-10 Coding Code Description E11.621 Type 2 diabetes mellitus with foot ulcer L97.522 Non-pressure chronic ulcer of other part of left foot with fat layer exposed I87.332 Chronic venous hypertension (idiopathic) with ulcer and inflammation of left lower extremity E11.622 Type 2 diabetes mellitus with other skin ulcer L97.822 Non-pressure chronic ulcer of other 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 Bathing/ Shower/ Hygiene May shower; gently cleanse wound with antibacterial soap, rinse and pat dry prior to dressing wounds No tub bath. Anesthetic (Use 'Patient Medications' Section for Anesthetic Order Entry) Lidocaine applied to wound bed Edema Control - Orders / Instructions Tubigrip single layer applied. - E Elevate, Exercise Daily and A void Standing for Long Periods of Time. Elevate leg(s) parallel to the floor when sitting. DO YOUR BEST to sleep in the bed at night. DO NOT sleep in your recliner. Long hours of sitting in a recliner leads to swelling of the legs and/or potential wounds on your backside. Off-Loading Open toe surgical shoe with peg assist. - left foot Other: - dial soap original gold recommended Medications-Please add to medication list. ntibiotics - Please pick up and take as prescribed P.O. A Wound Treatment Wound #1 - Foot Wound Laterality: Plantar, Left Cleanser: Byram Ancillary Kit - 15 Day Supply (Generic) Every Other Day/30 Days Discharge Instructions: Use supplies as instructed; Kit contains: (15) Saline Bullets; (15) 3x3 Gauze; 15 pr Gloves Cleanser: Soap and Water Every Other Day/30 Days Discharge Instructions: Gently cleanse wound with antibacterial soap, rinse and pat dry prior to dressing wounds Cleanser: Wound Cleanser Every  Other Day/30  Days Discharge Instructions: Wash your hands with soap and water. Remove old dressing, discard into plastic bag and place into trash. Cleanse the wound with Wound Cleanser prior to applying a clean dressing using gauze sponges, not tissues or cotton balls. Do not scrub or use excessive force. Pat dry using gauze sponges, not tissue or cotton balls. Prim Dressing: Hydrofera Blue Ready Transfer Foam, 2.5x2.5 (in/in) Every Other Day/30 Days ary Discharge Instructions: Apply Hydrofera Blue Ready to wound bed as directed Secondary Dressing: (BORDER) Zetuvit Plus SILICONE BORDER Dressing 4x4 (in/in) (Generic) Every Other Day/30 Days Discharge Instructions: Please do not put silicone bordered dressings under wraps. Use non-bordered dressing only. Wound #2 - Lower Leg Wound Laterality: Left, Anterior Cleanser: Byram Ancillary Kit - 15 Day Supply Every Other Day/30 Days Discharge Instructions: Use supplies as instructed; Kit contains: (15) Saline Bullets; (15) 3x3 Gauze; 15 pr Gloves Cleanser: Soap and Water Every Other Day/30 Days Discharge Instructions: Gently cleanse wound with antibacterial soap, rinse and pat dry prior to dressing wounds Cleanser: Wound Cleanser Every Other Day/30 Days Discharge Instructions: Wash your hands with soap and water. Remove old dressing, discard into plastic bag and place into trash. Cleanse the wound with Wound Cleanser prior to applying a clean dressing using gauze sponges, not tissues or cotton balls. Do not scrub or use excessive force. Pat dry using gauze sponges, not tissue or cotton balls. Prim Dressing: Hydrofera Blue Ready Transfer Foam, 2.5x2.5 (in/in) Every Other Day/30 Days ary Discharge Instructions: Apply Hydrofera Blue Ready to wound bed as directed Secondary Dressing: (BORDER) Zetuvit Plus SILICONE BORDER Dressing 4x4 (in/in) (Generic) Every Other Day/30 Days Discharge Instructions: Please do not put silicone bordered dressings under wraps. Use  non-bordered dressing only. Wound #3 - Lower Leg Wound Laterality: Left, Lateral Cleanser: Byram Ancillary Kit - 15 Day Supply Every Other Day/30 Days Discharge Instructions: Use supplies as instructed; Kit contains: (15) Saline Bullets; (15) 3x3 Gauze; 15 pr Gloves [name], [name] ([id number]) [phone].pdf Page 4 of 8 Cleanser: Soap and Water Every Other Day/30 Days Discharge Instructions: Gently cleanse wound with antibacterial soap, rinse and pat dry prior to dressing wounds Cleanser: Wound Cleanser Every Other Day/30 Days Discharge Instructions: Wash your hands with soap and water. Remove old dressing, discard into plastic bag and place into trash. Cleanse the wound with Wound Cleanser prior to applying a clean dressing using gauze sponges, not tissues or cotton balls. Do not scrub or use excessive force. Pat dry using gauze sponges, not tissue or cotton balls. Prim Dressing: Hydrofera Blue Ready Transfer Foam, 2.5x2.5 (in/in) Every Other Day/30 Days ary Discharge Instructions: Apply Hydrofera Blue Ready to wound bed as directed Secondary Dressing: (BORDER) Zetuvit Plus SILICONE BORDER Dressing 4x4 (in/in) (Generic) Every Other Day/30 Days Discharge Instructions: Please do not put silicone bordered dressings under wraps. Use non-bordered dressing only. Laboratory Bacteria identified in Wound by Culture (MICRO) - PCR culture completed on LLL anterior LOINC Code: 6462-6 Convenience Name: Wound culture routine Patient Medications llergies: No Known Drug Allergies A Notifications Medication Indication Start End 11/09/2022 levofloxacin DOSE 1 - oral 750 mg tablet - 1 tablet oral once daily x 14 days Electronic Signature(s) Signed: 11/09/2022 4:26:12 PM By: Angelina Pih Signed: 11/09/2022 5:08:46 PM By: Allen Derry PA-C Previous Signature: 11/09/2022 2:49:29 PM Version By: Allen Derry PA-C Previous Signature: 11/09/2022 2:43:13 PM Version By: Angelina Pih Entered By: Angelina Pih on 11/09/2022 16:23:51 -------------------------------------------------------------------------------- Problem List Details Patient Name: Date of Service: [name] LLO [place] Y, [name] [name]  [place] 11/09/2022 1:00 PM Medical Record Number: [id number] Patient Account Number: [id number] Date of Birth/Sex: Treating RN: 12-Mar-1970 (52 y.o. [name] [name] Primary Care Provider: Einar Crow Other Clinician: Referring Provider: Treating Provider/Extender: Johny Shears in Treatment: 1 Active Problems ICD-10 Encounter Code Description Active Date MDM Diagnosis E11.621 Type 2 diabetes mellitus with foot ulcer 11/01/2022 No Yes L97.522 Non-pressure chronic ulcer of other part of left foot with fat layer exposed 11/01/2022 No Yes I87.332 Chronic venous hypertension (idiopathic) with ulcer and inflammation of left 11/01/2022 No Yes [name] [name], [name] [name] ([id number]) [phone].pdf Page 5 of 8 lower extremity E11.622 Type 2 diabetes mellitus with other skin ulcer 11/01/2022 No Yes L97.822 Non-pressure chronic ulcer of other part of left lower leg with fat layer 11/01/2022 No Yes exposed I10 Essential (primary) hypertension 11/01/2022 No Yes Inactive Problems Resolved Problems Electronic Signature(s) Signed: 11/09/2022 1:14:03 PM By: Allen Derry PA-C Entered By: Allen Derry on 11/09/2022 13:14:03 -------------------------------------------------------------------------------- Progress Note Details Patient Name: Date of Service: [name] [place] Y, [name] [name] [place] 11/09/2022 1:00 PM Medical Record Number: [id number] Patient Account Number: [id number] Date of Birth/Sex: Treating RN: 20-Jan-1970 (52 y.o. [name] [name] Primary Care Provider: Einar Crow Other Clinician: Referring Provider: Treating Provider/Extender: Johny Shears in Treatment: 1 Subjective Chief Complaint Information obtained from  Patient Left foot and Left LE Ulcers History of Present Illness (HPI) Chronic/Inactive Condition: 11-01-2022 on evaluation today patient's ABIs were unfortunately noncompressible bilaterally though he has good pulses 1+ equal bilaterally when I palpated and the reason I could not feel them better it appears as because of the fact that he is having a lot of issues with swelling and again due to his size in general. Nonetheless his capillary refill was actually less than a second and it filled really quickly after testing and I feel like that his extremities were nice and warm he had hair growth as well in the lower extremities I do not think he needs to go for formal evaluation at this point. 11-01-2022 upon evaluation today patient's wound bed actually showed signs of having multiple wounds over the left lower extremity 2 in fact on the leg 1 on the plantar foot. He tells me he does not really know how long the foot is been present although he states his wife noted it 2 [name] [name] ago. Obviously looking at this wound I think this is much longer than 2 [name] [name] although he does not have feeling he is a diabetic type II with diabetic neuropathy and this subsequently means that he does not really know exactly when this occurred. He is not having any pain obviously at this point in the leg ulcers he tells me started out as blisters and again the foot he really does not know how that started. He does have a significant amount of callus buildup in the foot region. Patient does have a history as noted above diabetes mellitus type 2 with a hemoglobin A1c of 7.8 that was on October 25, 2022. He also has a history of hypertension and obesity but no other major medical problems that would particularly contribute to this. Well currently in regard to 11-09-2022 upon evaluation today patient appears to be doing his wounds as far as being stable for the most part although the left anterior leg actually showed signs of  cellulitis this is not doing as well as I like to see it. Has not been using Tubigrip quite right. I think we need to try to get this  infection under control before all else. He voiced understanding. With that being said I do think that at this point we are going to mark where the erythema redness is visually and tactile we are going to monitor and make sure that this does not spread if it does he needs to go to the ER soon as possible for now I am going to send in a prescription for Levaquin for him which I think is gena be the best way to go and he is in agreement with that plan. [name] [name], [name] [name] ([id number]) [id number] Page 6 of 8 Objective Constitutional Obese and well-hydrated in no acute distress. Vitals Time Taken: 1:15 PM, Height: 74 in, Weight: 546 lbs, BMI: 70.1, Temperature: 97.6 F, Pulse: 85 bpm, Respiratory Rate: 18 breaths/min, Blood Pressure: 159/85 mmHg. Respiratory normal breathing without difficulty. Psychiatric this patient is able to make decisions and demonstrates good insight into disease process. Alert and Oriented x 3. pleasant and cooperative. General Notes: Upon inspection patient's wound bed actually showed signs at all locations of doing okay I do not see any signs of infection systemically though locally I do see signs of cellulitis in regard to the leg in general. There is erythema and warmth. I am going to go ahead and recommend that we obtain a PCR culture and send a send he also needs to get the x-ray of the foot I think that still to be of utmost importance. Integumentary (Hair, Skin) Wound #1 status is Open. Original cause of wound was Gradually Appeared. The date acquired was: 08/19/2022. The wound has been in treatment 1 [name] [name]. The wound is located on the Left,Plantar Foot. The wound measures 1cm length x 1.9cm width x 0.2cm depth; 1.492cm^2 area and 0.298cm^3 volume. There is Fat Layer (Subcutaneous Tissue) exposed. There is no  tunneling or undermining noted. There is a medium amount of serosanguineous drainage noted. There is large (67-100%) red granulation within the wound bed. There is a small (1-33%) amount of necrotic tissue within the wound bed including Adherent Slough. Wound #2 status is Open. Original cause of wound was Gradually Appeared. The date acquired was: 08/19/2022. The wound has been in treatment 1 [name] [name]. The wound is located on the Left,Anterior Lower Leg. The wound measures 0.7cm length x 0.6cm width x 0.1cm depth; 0.33cm^2 area and 0.033cm^3 volume. There is Fat Layer (Subcutaneous Tissue) exposed. There is no tunneling or undermining noted. There is a medium amount of serosanguineous drainage noted. There is large (67-100%) red granulation within the wound bed. There is a small (1-33%) amount of necrotic tissue within the wound bed including Adherent Slough. Wound #3 status is Open. Original cause of wound was Gradually Appeared. The date acquired was: 08/19/2022. The wound has been in treatment 1 [name] [name]. The wound is located on the Left,Lateral Lower Leg. The wound measures 1.7cm length x 1.6cm width x 0.2cm depth; 2.136cm^2 area and 0.427cm^3 volume. There is Fat Layer (Subcutaneous Tissue) exposed. There is no tunneling or undermining noted. There is a medium amount of serosanguineous drainage noted. There is small (1-33%) red granulation within the wound bed. There is a large (67-100%) amount of necrotic tissue within the wound bed including Adherent Slough. Assessment Active Problems ICD-10 Type 2 diabetes mellitus with foot ulcer Non-pressure chronic ulcer of other part of left foot with fat layer exposed Chronic venous hypertension (idiopathic) with ulcer and inflammation of left lower extremity Type 2 diabetes mellitus with other skin ulcer Non-pressure chronic ulcer of other part of left lower leg  with fat layer exposed Essential (primary) hypertension Plan Follow-up Appointments: Return  Appointment in 1 week. Bathing/ Shower/ Hygiene: May shower; gently cleanse wound with antibacterial soap, rinse and pat dry prior to dressing wounds No tub bath. Anesthetic (Use 'Patient Medications' Section for Anesthetic Order Entry): Lidocaine applied to wound bed Edema Control - Orders / Instructions: Tubigrip single layer applied. - E Elevate, Exercise Daily and Avoid Standing for Long Periods of Time. Elevate leg(s) parallel to the floor when sitting. DO YOUR BEST to sleep in the bed at night. DO NOT sleep in your recliner. Long hours of sitting in a recliner leads to swelling of the legs and/or potential wounds on your backside. Off-Loading: Open toe surgical shoe with peg assist. - left foot Other: - dial soap original gold recommended Medications-Please add to medication list.: P.O. Antibiotics - Please pick up and take as prescribed Laboratory ordered were: Wound culture routine - PCR culture completed on LLL anterior The following medication(s) was prescribed: levofloxacin oral 750 mg tablet 1 1 tablet oral once daily x 14 days starting 11/09/2022 WOUND #1: - Foot Wound Laterality: Plantar, Left Cleanser: Byram Ancillary Kit - 15 Day Supply (Generic) Every Other Day/30 Days [name] [name], [name] [name] ([id number]) [id number] Page 7 of 8 Discharge Instructions: Use supplies as instructed; Kit contains: (15) Saline Bullets; (15) 3x3 Gauze; 15 pr Gloves Cleanser: Soap and Water Every Other Day/30 Days Discharge Instructions: Gently cleanse wound with antibacterial soap, rinse and pat dry prior to dressing wounds Cleanser: Wound Cleanser Every Other Day/30 Days Discharge Instructions: Wash your hands with soap and water. Remove old dressing, discard into plastic bag and place into trash. Cleanse the wound with Wound Cleanser prior to applying a clean dressing using gauze sponges, not tissues or cotton balls. Do not scrub or use excessive force. Pat dry using gauze  sponges, not tissue or cotton balls. Prim Dressing: Hydrofera Blue Ready Transfer Foam, 2.5x2.5 (in/in) Every Other Day/30 Days ary Discharge Instructions: Apply Hydrofera Blue Ready to wound bed as directed Secondary Dressing: (BORDER) Zetuvit Plus SILICONE BORDER Dressing 4x4 (in/in) (Generic) Every Other Day/30 Days Discharge Instructions: Please do not put silicone bordered dressings under wraps. Use non-bordered dressing only. WOUND #2: - Lower Leg Wound Laterality: Left, Anterior Cleanser: Byram Ancillary Kit - 15 Day Supply Every Other Day/30 Days Discharge Instructions: Use supplies as instructed; Kit contains: (15) Saline Bullets; (15) 3x3 Gauze; 15 pr Gloves Cleanser: Soap and Water Every Other Day/30 Days Discharge Instructions: Gently cleanse wound with antibacterial soap, rinse and pat dry prior to dressing wounds Cleanser: Wound Cleanser Every Other Day/30 Days Discharge Instructions: Wash your hands with soap and water. Remove old dressing, discard into plastic bag and place into trash. Cleanse the wound with Wound Cleanser prior to applying a clean dressing using gauze sponges, not tissues or cotton balls. Do not scrub or use excessive force. Pat dry using gauze sponges, not tissue or cotton balls. Prim Dressing: Hydrofera Blue Ready Transfer Foam, 2.5x2.5 (in/in) Every Other Day/30 Days ary Discharge Instructions: Apply Hydrofera Blue Ready to wound bed as directed Secondary Dressing: (BORDER) Zetuvit Plus SILICONE BORDER Dressing 4x4 (in/in) (Generic) Every Other Day/30 Days Discharge Instructions: Please do not put silicone bordered dressings under wraps. Use non-bordered dressing only. WOUND #3: - Lower Leg Wound Laterality: Left, Lateral Cleanser: Byram Ancillary Kit - 15 Day Supply Every Other Day/30 Days Discharge Instructions: Use supplies as instructed; Kit contains: (15) Saline Bullets; (15) 3x3 Gauze; 15 pr Gloves Cleanser: Soap and Water Every Other  Day/30  Days Discharge Instructions: Gently cleanse wound with antibacterial soap, rinse and pat dry prior to dressing wounds Cleanser: Wound Cleanser Every Other Day/30 Days Discharge Instructions: Wash your hands with soap and water. Remove old dressing, discard into plastic bag and place into trash. Cleanse the wound with Wound Cleanser prior to applying a clean dressing using gauze sponges, not tissues or cotton balls. Do not scrub or use excessive force. Pat dry using gauze sponges, not tissue or cotton balls. Prim Dressing: Hydrofera Blue Ready Transfer Foam, 2.5x2.5 (in/in) Every Other Day/30 Days ary Discharge Instructions: Apply Hydrofera Blue Ready to wound bed as directed Secondary Dressing: (BORDER) Zetuvit Plus SILICONE BORDER Dressing 4x4 (in/in) (Generic) Every Other Day/30 Days Discharge Instructions: Please do not put silicone bordered dressings under wraps. Use non-bordered dressing only. 1. I would recommend currently that we have the patient continue to monitor for any signs of infection or worsening. I do believe that at this point he does have cellulitis I am going to send in a prescription for Levaquin for him and he is in agreement with that plan. 2. I am going to recommend as well that we have the patient monitor this erythema daily I did go ahead and mark his leg. 3. I am not going to send in a PCR culture and we will see what the results of that show. Depending on the results of the culture I will make any adjustments in care as necessary going forward. We will see patient back for reevaluation in 1 week here in the clinic. If anything worsens or changes patient will contact our office for additional recommendations. Electronic Signature(s) Signed: 11/09/2022 2:49:41 PM By: Allen Derry PA-C Entered By: Allen Derry on 11/09/2022 14:49:40 -------------------------------------------------------------------------------- SuperBill Details Patient Name: Date of Service: [name] LLO [place]  Y, [name] 11/09/2022 Medical Record Number: [id number] Patient Account Number: [id number] Date of Birth/Sex: Treating RN: [birth year] (52 y.o. 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 Chronic venous hypertension (idiopathic) with ulcer and inflammation of left lower extremity E11.622 Type 2 diabetes mellitus with other skin ulcer L97.822 Non-pressure chronic ulcer of other part of left lower leg with fat layer exposed I10 Essential (primary) hypertension Facility Procedures : CPT4 Code: [id number] Description: 99214 - WOUND CARE VISIT-LEV 4 EST PT Modifier: Quantity: 1 Physician Procedures : CPT4 Code Description Modifier [id number] 99214 - WC PHYS LEVEL 4 - EST PT ICD-10 Diagnosis Description E11.621 Type 2 diabetes mellitus with foot ulcer L97.522 Non-pressure chronic ulcer of other part of left foot with fat layer exposed I87.332 Chronic  venous hypertension (idiopathic) with ulcer and inflammation of left lower extremity E11.622 Type 2 diabetes mellitus with other skin ulcer Quantity: 1 Electronic Signature(s) Signed: 11/09/2022 4:24:29 PM By: Angelina Pih Signed: 11/09/2022 5:08:46 PM By: Allen Derry PA-C Previous Signature: 11/09/2022 2:49:59 PM Version By: Allen Derry PA-C Entered By: Angelina Pih on 11/09/2022 16:24:29

## 2022-11-09 NOTE — Progress Notes (Signed)
DESHAN, HEMMELGARN ([id number]) [id number] Page 1 of 12 Visit Report for 11/09/2022 Arrival Information Details Patient Name: Date of Service: [name] [name], [name] [name] [place] 11/09/2022 1:00 PM Medical Record Number: [id number] Patient Account Number: [id number] Date of Birth/Sex: Treating RN: 09-Jan-1970 (52 [name] Primary Care Malekai Markwood: Einar Crow Other Clinician: Referring Mariette Cowley: Treating Analei Whinery/Extender: Johny Shears in Treatment: 1 Visit Information History Since Last Visit Added or deleted any medications: Yes Patient Arrived: Ambulatory Any new allergies or adverse reactions: No Arrival Time: 13:29 Had a fall or experienced change in No Accompanied By: self activities of daily living that may affect Transfer Assistance: None risk of falls: Patient Identification Verified: Yes Hospitalized since last visit: No Secondary Verification Process Completed: Yes Has Dressing in Place as Prescribed: Yes Patient Has Alerts: Yes Has Compression in Place as Prescribed: Yes Patient Alerts: type 2 diabetic Pain Present Now: No ABI 11/01/22 Deuel Notes not wearing tubi grip properly Electronic Signature(s) Signed: 11/09/2022 4:26:12 PM By: Angelina Pih Previous Signature: 11/09/2022 1:30:18 PM Version By: Angelina Pih Entered By: Angelina Pih on 11/09/2022 13:51:56 -------------------------------------------------------------------------------- Clinic Level of Care Assessment Details Patient Name: Date of Service: [name] [name],  [place] 11/09/2022 1:00 PM Medical Record Number: [id number] Patient Account Number: [id number] Date of Birth/Sex: Treating RN: 1970/02/24 (52 [name] Primary Care Marciana Uplinger: Einar Crow Other Clinician: Referring Arjay Jaskiewicz: Treating Torion Hulgan/Extender: Johny Shears in Treatment: 1 Clinic Level of Care Assessment Items TOOL 4 Quantity Score []   - 0 Use when only an EandM is performed on FOLLOW-UP visit ASSESSMENTS - Nursing Assessment / Reassessment X- 1 10 Reassessment of Co-morbidities (includes updates in patient status) X- 1 5 Reassessment of Adherence to Treatment Plan [name] [name], [name] [name] ([id number]) [id number] Page 2 of 12 ASSESSMENTS - Wound and Skin A ssessment / Reassessment []  - 0 Simple Wound Assessment / Reassessment - one wound X- 3 5 Complex Wound Assessment / Reassessment - multiple wounds []  - 0 Dermatologic / Skin Assessment (not related to wound area) ASSESSMENTS - Focused Assessment []  - 0 Circumferential Edema Measurements - multi extremities []  - 0 Nutritional Assessment / Counseling / Intervention []  - 0 Lower Extremity Assessment (monofilament, tuning fork, pulses) []  - 0 Peripheral Arterial Disease Assessment (using hand held doppler) ASSESSMENTS - Ostomy and/or Continence Assessment and Care []  - 0 Incontinence Assessment and Management []  - 0 Ostomy Care Assessment and Management (repouching, etc.) PROCESS - Coordination of Care X - Simple Patient / Family Education for ongoing care 1 15 []  - 0 Complex (extensive) Patient / Family Education for ongoing care X- 1 10 Staff obtains Chiropractor, Records, T Results / Process Orders est []  - 0 Staff telephones HHA, Nursing Homes / Clarify orders / etc []  - 0 Routine Transfer to another Facility (non-emergent condition) []  - 0 Routine Hospital Admission (non-emergent condition) []  - 0 New Admissions / Manufacturing engineer / Ordering NPWT Apligraf, etc. , []  - 0 Emergency Hospital Admission (emergent condition) X- 1 10 Simple Discharge Coordination []  - 0 Complex (extensive) Discharge Coordination PROCESS - Special Needs []  - 0 Pediatric / Minor Patient Management []  - 0 Isolation Patient Management []  - 0 Hearing / Language / Visual special needs []  - 0 Assessment of Community assistance (transportation,  D/C planning, etc.) []  - 0 Additional assistance / Altered mentation []  - 0 Support Surface(s) Assessment (bed, cushion, seat, etc.) INTERVENTIONS - Wound Cleansing / Measurement []  - 0 Simple Wound Cleansing -  one wound X- 3 5 Complex Wound Cleansing - multiple wounds X- 1 5 Wound Imaging (photographs - any number of wounds) []  - 0 Wound Tracing (instead of photographs) []  - 0 Simple Wound Measurement - one wound X- 3 5 Complex Wound Measurement - multiple wounds INTERVENTIONS - Wound Dressings X - Small Wound Dressing one or multiple wounds 3 10 []  - 0 Medium Wound Dressing one or multiple wounds []  - 0 Large Wound Dressing one or multiple wounds X- 1 5 Application of Medications - topical []  - 0 Application of Medications - injection INTERVENTIONS - Miscellaneous []  - 0 External ear exam [name] [name], [name] [name] ([id number]) [id number] Page 3 of 12 []  - 0 Specimen Collection (cultures, biopsies, blood, body fluids, etc.) []  - 0 Specimen(s) / Culture(s) sent or taken to Lab for analysis []  - 0 Patient Transfer (multiple staff / Michiel Sites Lift / Similar devices) []  - 0 Simple Staple / Suture removal (25 or less) []  - 0 Complex Staple / Suture removal (26 or more) []  - 0 Hypo / Hyperglycemic Management (close monitor of Blood Glucose) []  - 0 Ankle / Brachial Index (ABI) - do not check if billed separately X- 1 5 Vital Signs Has the patient been seen at the hospital within the last three years: Yes Total Score: 140 Level Of Care: New/Established - Level 4 Electronic Signature(s) Signed: 11/09/2022 4:26:12 PM By: Angelina Pih Entered By: Angelina Pih on 11/09/2022 16:24:21 -------------------------------------------------------------------------------- Encounter Discharge Information Details Patient Name: Date of Service: [name] [name], [name] [name] [place] 11/09/2022 1:00 PM Medical Record Number: [id number] Patient Account Number: [id number] Date of  Birth/Sex: Treating RN: February 12, 1970 (52 [name] Primary Care Patirica Longshore: Einar Crow Other Clinician: Referring Darya Bigler: Treating Jaylie Neaves/Extender: Johny Shears in Treatment: 1 Encounter Discharge Information Items Discharge Condition: Stable Ambulatory Status: Ambulatory Discharge Destination: Home Transportation: Private Auto Accompanied By: self Schedule Follow-up Appointment: Yes Clinical Summary of Care: Electronic Signature(s) Signed: 11/09/2022 4:25:19 PM By: Angelina Pih Entered By: Angelina Pih on 11/09/2022 16:25:19 Lower Extremity Assessment Details -------------------------------------------------------------------------------- [name] [name] ([id number]) [id number] Page 4 of 12 Patient Name: Date of Service: [name] [name], [place] 11/09/2022 1:00 PM Medical Record Number: [id number] Patient Account Number: [id number] Date of Birth/Sex: Treating RN: [birth year] (64 [name] Primary Care Tabathia Knoche: Einar Crow Other Clinician: Referring Ron Junco: Treating Eneida Evers/Extender: Johny Shears in Treatment: 1 Edema Assessment Left: Right: Assessed: No No Edema: Yes Calf Left: Right: Point of Measurement: 39 cm From Medial Instep 55.5 cm Ankle Left: Right: Point of Measurement: 12 cm From Medial Instep 34 cm Vascular Assessment Left: Right: Pulses: Dorsalis Pedis Palpable: Yes Extremity colors, hair growth, and conditions: Extremity Color: Red Hair Growth on Extremity: No Temperature of Extremity: Warm Capillary Refill: < 3 seconds Toe Nail Assessment Left: Right: Thick: Yes Discolored: No Deformed: No Improper Length and Hygiene: No Electronic Signature(s) Signed: 11/09/2022 4:26:12 PM By: Angelina Pih Entered By: Angelina Pih on 11/09/2022  13:27:41 -------------------------------------------------------------------------------- Multi Wound Chart Details Patient Name: Date of Service: [name] [name], [name] [name] [place] 11/09/2022 1:00 PM Medical Record Number: [id number] Patient Account Number: [id number] Date of Birth/Sex: Treating RN: 27-May-1970 (16 [name] Primary Care Toussaint Golson: Einar Crow Other Clinician: Referring Marchello Rothgeb: Treating Burton Gahan/Extender: Johny Shears in Treatment: 1 Vital Signs Height(in): 74 Pulse(bpm): 85 Weight(lbs): 546 Blood Pressure(mmHg): 159/85 Body Mass Index(BMI): 70.1 Temperature(F): 97.6 Respiratory Rate(breaths/min): 18 [[name] [name], [name] [name] ([id number]):Photos:] [[id number] Page 5 of  12:1 2 3] Left, Plantar Foot Left, Anterior Lower Leg Left, Lateral Lower Leg Wound Location: Gradually Appeared Gradually Appeared Gradually Appeared Wounding Event: Diabetic Wound/Ulcer of the Lower Diabetic Wound/Ulcer of the Lower Diabetic Wound/Ulcer of the Lower Primary Etiology: Extremity Extremity Extremity Hypertension, Type II Diabetes, Gout Hypertension, Type II Diabetes, Gout Hypertension, Type II Diabetes, Gout Comorbid History: 08/19/2022 08/19/2022 08/19/2022 Date Acquired: 1 1 1  Weeks of Treatment: Open Open Open Wound Status: No No No Wound Recurrence: 1x1.9x0.2 0.7x0.6x0.1 1.7x1.6x0.2 Measurements L x W x D (cm) 1.492 0.33 2.136 A (cm) : rea 0.298 0.033 0.427 Volume (cm) : -97.90% 78.50% -51.10% % Reduction in A rea: -31.90% 78.40% -50.90% % Reduction in Volume: Grade 1 Grade 1 Grade 1 Classification: Medium Medium Medium Exudate A mount: Serosanguineous Serosanguineous Serosanguineous Exudate Type: red, brown red, brown red, brown Exudate Color: Large (67-100%) Large (67-100%) Small (1-33%) Granulation A mount: Red Red Red Granulation Quality: Small (1-33%) Small (1-33%) Large (67-100%) Necrotic A mount: Fat  Layer (Subcutaneous Tissue): Yes Fat Layer (Subcutaneous Tissue): Yes Fat Layer (Subcutaneous Tissue): Yes Exposed Structures: None None None Epithelialization: Treatment Notes Electronic Signature(s) Signed: 11/09/2022 4:23:26 PM By: Angelina Pih Previous Signature: 11/09/2022 1:31:32 PM Version By: Angelina Pih Entered By: Angelina Pih on 11/09/2022 16:23:25 -------------------------------------------------------------------------------- Multi-Disciplinary Care Plan Details Patient Name: Date of Service: [name] [name], [name] [name] [place] 11/09/2022 1:00 PM Medical Record Number: [id number] Patient Account Number: [id number] Date of Birth/Sex: Treating RN: [birth year] (29 [name] Primary Care Miniya Miguez: Einar Crow Other Clinician: Referring Wylma Tatem: Treating Lameka Disla/Extender: Johny Shears in Treatment: 1 Active Inactive Necrotic Tissue Nursing Diagnoses: Impaired tissue integrity related to necrotic/devitalized tissue Knowledge deficit related to management of necrotic/devitalized tissue Goals: Necrotic/devitalized tissue will be minimized in the wound bed Date Initiated: 11/01/2022 Target Resolution Date: 12/27/2022 [name] [name], [name] [name] ([id number]) [phone] Page 6 of 12 Goal Status: Active Patient/caregiver will verbalize understanding of reason and process for debridement of necrotic tissue Date Initiated: 11/01/2022 Date Inactivated: 11/01/2022 Target Resolution Date: 11/01/2022 Goal Status: Met Interventions: Assess patient pain level pre-, during and post procedure and prior to discharge Provide education on necrotic tissue and debridement process Treatment Activities: Apply topical anesthetic as ordered : 11/01/2022 Excisional debridement : 11/01/2022 Notes: Wound/Skin Impairment Nursing Diagnoses: Impaired tissue integrity Knowledge deficit related to ulceration/compromised skin  integrity Goals: Ulcer/skin breakdown will have a volume reduction of 30% by week 4 Date Initiated: 11/01/2022 Target Resolution Date: 11/29/2022 Goal Status: Active Ulcer/skin breakdown will have a volume reduction of 50% by week 8 Date Initiated: 11/01/2022 Target Resolution Date: 12/27/2022 Goal Status: Active Ulcer/skin breakdown will have a volume reduction of 80% by week 12 Date Initiated: 11/01/2022 Target Resolution Date: 01/24/2023 Goal Status: Active Ulcer/skin breakdown will heal within 14 weeks Date Initiated: 11/01/2022 Target Resolution Date: 02/07/2023 Goal Status: Active Interventions: Assess patient/caregiver ability to obtain necessary supplies Assess patient/caregiver ability to perform ulcer/skin care regimen upon admission and as needed Assess ulceration(s) every visit Provide education on ulcer and skin care Notes: Electronic Signature(s) Signed: 11/09/2022 4:24:36 PM By: Angelina Pih Entered By: Angelina Pih on 11/09/2022 16:24:36 -------------------------------------------------------------------------------- Pain Assessment Details Patient Name: Date of Service: [name] [name], [name] [name] [place] 11/09/2022 1:00 PM Medical Record Number: [id number] Patient Account Number: [id number] Date of Birth/Sex: Treating RN: Oct 03, 1970 (20 [name] Primary Care Donell Tomkins: Einar Crow Other Clinician: Referring Ria Redcay: Treating Briana Newman/Extender: Johny Shears in Treatment: 1 Active Problems Location of Pain Severity and Description  of Pain Patient Has Paino No Site Locations Rate the pain. [name] [name], [name] [name] ([id number]) [id number] Page 7 of 12 Rate the pain. Current Pain Level: 0 Pain Management and Medication Current Pain Management: Electronic Signature(s) Signed: 11/09/2022 1:30:53 PM By: Angelina Pih Entered By: Angelina Pih on 11/09/2022  13:30:53 -------------------------------------------------------------------------------- Patient/Caregiver Education Details Patient Name: Date of Service: [name] [name], [name] [name] [place] 11/5/2024andnbsp1:00 PM Medical Record Number: [id number] Patient Account Number: [id number] Date of Birth/Gender: Treating RN: 25-Aug-1970 (19 [name] Primary Care Physician: Einar Crow Other Clinician: Referring Physician: Treating Physician/Extender: Johny Shears in Treatment: 1 Education Assessment Education Provided To: Patient Education Topics Provided Wound/Skin Impairment: Handouts: Caring for Your Ulcer Methods: Explain/Verbal Responses: State content correctly Electronic Signature(s) Signed: 11/09/2022 4:26:12 PM By: Angelina Pih Entered By: Angelina Pih on 11/09/2022 16:24:45 [name] [name] ([id number]) [id number] Page 8 of 12 -------------------------------------------------------------------------------- Wound Assessment Details Patient Name: Date of Service: [name] [name],  [place] 11/09/2022 1:00 PM Medical Record Number: [id number] Patient Account Number: [id number] Date of Birth/Sex: Treating RN: [birth year] (78 [name] Primary Care Zayah Keilman: Einar Crow Other Clinician: Referring Monalisa Bayless: Treating Sheretha Shadd/Extender: Johny Shears in Treatment: 1 Wound Status Wound Number: 1 Primary Etiology: Diabetic Wound/Ulcer of the Lower Extremity Wound Location: Left, Plantar Foot Wound Status: Open Wounding Event: Gradually Appeared Comorbid History: Hypertension, Type II Diabetes, Gout Date Acquired: 08/19/2022 Weeks Of Treatment: 1 Clustered Wound: No Photos Wound Measurements Length: (cm) 1 Width: (cm) 1.9 Depth: (cm) 0.2 Area: (cm) 1.492 Volume: (cm) 0.298 % Reduction in Area: -97.9% % Reduction in Volume: -31.9% Epithelialization: None Tunneling:  No Undermining: No Wound Description Classification: Grade 1 Exudate Amount: Medium Exudate Type: Serosanguineous Exudate Color: red, brown Foul Odor After Cleansing: No Slough/Fibrino Yes Wound Bed Granulation Amount: Large (67-100%) Exposed Structure Granulation Quality: Red Fat Layer (Subcutaneous Tissue) Exposed: Yes Necrotic Amount: Small (1-33%) Necrotic Quality: Adherent Slough Treatment Notes Wound #1 (Foot) Wound Laterality: Plantar, Left Cleanser Byram Ancillary Kit - 15 Day Supply Discharge Instruction: Use supplies as instructed; Kit contains: (15) Saline Bullets; (15) 3x3 Gauze; 15 pr Gloves Soap and Water Discharge Instruction: Gently cleanse wound with antibacterial soap, rinse and pat dry prior to dressing wounds Wound Cleanser Discharge Instruction: Wash your hands with soap and water. Remove old dressing, discard into plastic bag and place into trash. Cleanse the KREG, EARHART ([id number]) [id number] Page 9 of 12 wound with Wound Cleanser prior to applying a clean dressing using gauze sponges, not tissues or cotton balls. Do not scrub or use excessive force. Pat dry using gauze sponges, not tissue or cotton balls. Peri-Wound Care Topical Primary Dressing Hydrofera Blue Ready Transfer Foam, 2.5x2.5 (in/in) Discharge Instruction: Apply Hydrofera Blue Ready to wound bed as directed Secondary Dressing (BORDER) Zetuvit Plus SILICONE BORDER Dressing 4x4 (in/in) Discharge Instruction: Please do not put silicone bordered dressings under wraps. Use non-bordered dressing only. Secured With Compression Wrap Compression Stockings Add-Ons Electronic Signature(s) Signed: 11/09/2022 4:26:12 PM By: Angelina Pih Entered By: Angelina Pih on 11/09/2022 13:25:53 -------------------------------------------------------------------------------- Wound Assessment Details Patient Name: Date of Service: [name] [name], [name] [name] [place] 11/09/2022 1:00  PM Medical Record Number: [id number] Patient Account Number: [id number] Date of Birth/Sex: Treating RN: 10/23/1970 (45 [name] Primary Care Alexcis Bicking: Einar Crow Other Clinician: Referring Samona Chihuahua: Treating Stephane Niemann/Extender: Johny Shears in Treatment: 1 Wound Status Wound Number: 2 Primary Etiology: Diabetic Wound/Ulcer of the Lower Extremity Wound Location: Left, Anterior Lower  Leg Wound Status: Open Wounding Event: Gradually Appeared Comorbid History: Hypertension, Type II Diabetes, Gout Date Acquired: 08/19/2022 Weeks Of Treatment: 1 Clustered Wound: No Photos Wound Measurements Length: (cm) 0.7 Width: (cm) 0.6 Depth: (cm) 0.1 [name] [name], [name] [name] ([id number]) Area: (cm) 0.33 Volume: (cm) 0.033 % Reduction in Area: 78.5% % Reduction in Volume: 78.4% Epithelialization: None [id number] Page 10 of 12 Tunneling: No Undermining: No Wound Description Classification: Grade 1 Exudate Amount: Medium Exudate Type: Serosanguineous Exudate Color: red, brown Foul Odor After Cleansing: No Slough/Fibrino Yes Wound Bed Granulation Amount: Large (67-100%) Exposed Structure Granulation Quality: Red Fat Layer (Subcutaneous Tissue) Exposed: Yes Necrotic Amount: Small (1-33%) Necrotic Quality: Adherent Slough Treatment Notes Wound #2 (Lower Leg) Wound Laterality: Left, Anterior Cleanser Byram Ancillary Kit - 15 Day Supply Discharge Instruction: Use supplies as instructed; Kit contains: (15) Saline Bullets; (15) 3x3 Gauze; 15 pr Gloves Soap and Water Discharge Instruction: Gently cleanse wound with antibacterial soap, rinse and pat dry prior to dressing wounds Wound Cleanser Discharge Instruction: Wash your hands with soap and water. Remove old dressing, discard into plastic bag and place into trash. Cleanse the wound with Wound Cleanser prior to applying a clean dressing using gauze sponges, not tissues or cotton  balls. Do not scrub or use excessive force. Pat dry using gauze sponges, not tissue or cotton balls. Peri-Wound Care Topical Primary Dressing Hydrofera Blue Ready Transfer Foam, 2.5x2.5 (in/in) Discharge Instruction: Apply Hydrofera Blue Ready to wound bed as directed Secondary Dressing (BORDER) Zetuvit Plus SILICONE BORDER Dressing 4x4 (in/in) Discharge Instruction: Please do not put silicone bordered dressings under wraps. Use non-bordered dressing only. Secured With Compression Wrap Compression Stockings Add-Ons Electronic Signature(s) Signed: 11/09/2022 4:26:12 PM By: Angelina Pih Entered By: Angelina Pih on 11/09/2022 13:26:30 -------------------------------------------------------------------------------- Wound Assessment Details Patient Name: Date of Service: [name] [name], [name] [name] [place] 11/09/2022 1:00 PM Medical Record Number: [id number] Patient Account Number: [id number] Date of Birth/Sex: Treating RN: 23-Jul-1970 (83 [name] Primary Care Jolayne Branson: Einar Crow Other Clinician: Referring Judiann Celia: Treating Rosamund Nyland/Extender: Johny Shears in Treatment: 1 [name] [name], [name] [name] ([id number]) [id number] Page 11 of 12 Wound Status Wound Number: 3 Primary Etiology: Diabetic Wound/Ulcer of the Lower Extremity Wound Location: Left, Lateral Lower Leg Wound Status: Open Wounding Event: Gradually Appeared Comorbid History: Hypertension, Type II Diabetes, Gout Date Acquired: 08/19/2022 Weeks Of Treatment: 1 Clustered Wound: No Photos Wound Measurements Length: (cm) 1.7 Width: (cm) 1.6 Depth: (cm) 0.2 Area: (cm) 2.136 Volume: (cm) 0.427 % Reduction in Area: -51.1% % Reduction in Volume: -50.9% Epithelialization: None Tunneling: No Undermining: No Wound Description Classification: Grade 1 Exudate Amount: Medium Exudate Type: Serosanguineous Exudate Color: red, brown Foul Odor After Cleansing:  No Slough/Fibrino Yes Wound Bed Granulation Amount: Small (1-33%) Exposed Structure Granulation Quality: Red Fat Layer (Subcutaneous Tissue) Exposed: Yes Necrotic Amount: Large (67-100%) Necrotic Quality: Adherent Slough Treatment Notes Wound #3 (Lower Leg) Wound Laterality: Left, Lateral Cleanser Byram Ancillary Kit - 15 Day Supply Discharge Instruction: Use supplies as instructed; Kit contains: (15) Saline Bullets; (15) 3x3 Gauze; 15 pr Gloves Soap and Water Discharge Instruction: Gently cleanse wound with antibacterial soap, rinse and pat dry prior to dressing wounds Wound Cleanser Discharge Instruction: Wash your hands with soap and water. Remove old dressing, discard into plastic bag and place into trash. Cleanse the wound with Wound Cleanser prior to applying a clean dressing using gauze sponges, not tissues or cotton balls. Do not scrub or use excessive force. Pat dry using gauze sponges, not tissue or  cotton balls. Peri-Wound Care Topical Primary Dressing Hydrofera Blue Ready Transfer Foam, 2.5x2.5 (in/in) Discharge Instruction: Apply Hydrofera Blue Ready to wound bed as directed Secondary Dressing (BORDER) Zetuvit Plus SILICONE BORDER Dressing 4x4 (in/in) Discharge Instruction: Please do not put silicone bordered dressings under wraps. Use non-bordered dressing only. Secured With Compression Wrap Compression Stockings Add-Ons [name] [name], FEINSTEIN ([id number]) [id number] Page 12 of 12 Electronic Signature(s) Signed: 11/09/2022 4:26:12 PM By: Angelina Pih Entered By: Angelina Pih on 11/09/2022 13:27:01 -------------------------------------------------------------------------------- Vitals Details Patient Name: Date of Service: [name] Enis Slipper, [name] [name] [place] 11/09/2022 1:00 PM Medical Record Number: [id number] Patient Account Number: [id number] Date of Birth/Sex: Treating RN: 12-21-1970 (24 [name] Primary Care Erienne Spelman: Einar Crow Other Clinician: Referring Trenace Coughlin: Treating Taressa Rauh/Extender: Johny Shears in Treatment: 1 Vital Signs Time Taken: 13:15 Temperature (F): 97.6 Height (in): 74 Pulse (bpm): 85 Weight (lbs): 546 Respiratory Rate (breaths/min): 18 Body Mass Index (BMI): 70.1 Blood Pressure (mmHg): 159/85 Reference Range: 80 - 120 mg / dl Electronic Signature(s) Signed: 11/09/2022 1:30:35 PM By: Angelina Pih Entered By: Angelina Pih on 11/09/2022 13:30:35

## 2022-11-15 ENCOUNTER — Ambulatory Visit: Payer: BC Managed Care – PPO | Admitting: Physician Assistant

## 2022-11-18 ENCOUNTER — Encounter: Payer: BC Managed Care – PPO | Admitting: Physician Assistant

## 2022-11-18 DIAGNOSIS — I87332 Chronic venous hypertension (idiopathic) with ulcer and inflammation of left lower extremity: Secondary | ICD-10-CM | POA: Diagnosis not present

## 2022-11-18 NOTE — Progress Notes (Signed)
RAYLIN, BUTLAND ([id number]) [id number] Page 1 of 12 Visit Report for 11/18/2022 Arrival Information Details Patient Name: Date of Service: [name] [name], [name] 11/18/2022 1:00 PM Medical Record Number: [id number] Patient Account Number: [id number] Date of Birth/Sex: Treating RN: 05-05-1970 (52 [name] Primary Care Adon Gehlhausen: Einar Crow Other Clinician: Referring Hazely Sealey: Treating Jazma Pickel/Extender: Johny Shears in Treatment: 2 Visit Information History Since Last Visit Added or deleted any medications: No Patient Arrived: Ambulatory Any new allergies or adverse reactions: No Arrival Time: 13:14 Had a fall or experienced change in No Accompanied By: self activities of daily living that may affect Transfer Assistance: None risk of falls: Patient Identification Verified: Yes Hospitalized since last visit: No Secondary Verification Process Completed: Yes Has Dressing in Place as Prescribed: Yes Patient Has Alerts: Yes Pain Present Now: No Patient Alerts: type 2 diabetic ABI 11/01/22 [place] Electronic Signature(s) Signed: 11/18/2022 4:29:52 PM By: Angelina Pih Entered By: Angelina Pih on 11/18/2022 10:14:49 -------------------------------------------------------------------------------- Clinic Level of Care Assessment Details Patient Name: Date of Service: [name] [name], [name] 11/18/2022 1:00 PM Medical Record Number: [id number] Patient Account Number: [id number] Date of Birth/Sex: Treating RN: 1970-05-11 (69 [name] Primary Care Deanne Bedgood: Einar Crow Other Clinician: Referring Langley Flatley: Treating Vitaly Wanat/Extender: Johny Shears in Treatment: 2 Clinic Level of Care Assessment Items TOOL 4 Quantity Score []  - 0 Use when only an EandM is performed on FOLLOW-UP visit ASSESSMENTS - Nursing Assessment / Reassessment X- 1 10 Reassessment of Co-morbidities  (includes updates in patient status) X- 1 5 Reassessment of Adherence to Treatment Plan ASSESSMENTS - Wound and Skin A ssessment / Reassessment []  - 0 Simple Wound Assessment / Reassessment - one wound [name], [name] ([id number]) [id number] Page 2 of 12 X- 3 5 Complex Wound Assessment / Reassessment - multiple wounds []  - 0 Dermatologic / Skin Assessment (not related to wound area) ASSESSMENTS - Focused Assessment []  - 0 Circumferential Edema Measurements - multi extremities []  - 0 Nutritional Assessment / Counseling / Intervention []  - 0 Lower Extremity Assessment (monofilament, tuning fork, pulses) []  - 0 Peripheral Arterial Disease Assessment (using hand held doppler) ASSESSMENTS - Ostomy and/or Continence Assessment and Care []  - 0 Incontinence Assessment and Management []  - 0 Ostomy Care Assessment and Management (repouching, etc.) PROCESS - Coordination of Care X - Simple Patient / Family Education for ongoing care 1 15 []  - 0 Complex (extensive) Patient / Family Education for ongoing care X- 1 10 Staff obtains Chiropractor, Records, T Results / Process Orders est []  - 0 Staff telephones HHA, Nursing Homes / Clarify orders / etc []  - 0 Routine Transfer to another Facility (non-emergent condition) []  - 0 Routine Hospital Admission (non-emergent condition) []  - 0 New Admissions / Manufacturing engineer / Ordering NPWT Apligraf, etc. , []  - 0 Emergency Hospital Admission (emergent condition) X- 1 10 Simple Discharge Coordination []  - 0 Complex (extensive) Discharge Coordination PROCESS - Special Needs []  - 0 Pediatric / Minor Patient Management []  - 0 Isolation Patient Management []  - 0 Hearing / Language / Visual special needs []  - 0 Assessment of Community assistance (transportation, D/C planning, etc.) []  - 0 Additional assistance / Altered mentation []  - 0 Support Surface(s) Assessment (bed, cushion, seat,  etc.) INTERVENTIONS - Wound Cleansing / Measurement []  - 0 Simple Wound Cleansing - one wound X- 3 5 Complex Wound Cleansing - multiple wounds X- 1 5 Wound Imaging (photographs - any number of wounds) []  -  0 Wound Tracing (instead of photographs) []  - 0 Simple Wound Measurement - one wound X- 3 5 Complex Wound Measurement - multiple wounds INTERVENTIONS - Wound Dressings X - Small Wound Dressing one or multiple wounds 3 10 []  - 0 Medium Wound Dressing one or multiple wounds []  - 0 Large Wound Dressing one or multiple wounds X- 1 5 Application of Medications - topical []  - 0 Application of Medications - injection INTERVENTIONS - Miscellaneous []  - 0 External ear exam []  - 0 Specimen Collection (cultures, biopsies, blood, body fluids, etc.) []  - 0 Specimen(s) / Culture(s) sent or taken to Lab for analysis [name], [name] ([id number]) [id number] Page 3 of 12 []  - 0 Patient Transfer (multiple staff / Nurse, adult / Similar devices) []  - 0 Simple Staple / Suture removal (25 or less) []  - 0 Complex Staple / Suture removal (26 or more) []  - 0 Hypo / Hyperglycemic Management (close monitor of Blood Glucose) []  - 0 Ankle / Brachial Index (ABI) - do not check if billed separately X- 1 5 Vital Signs Has the patient been seen at the hospital within the last three years: Yes Total Score: 140 Level Of Care: New/Established - Level 4 Electronic Signature(s) Signed: 11/18/2022 4:29:52 PM By: Angelina Pih Entered By: Angelina Pih on 11/18/2022 11:00:49 -------------------------------------------------------------------------------- Encounter Discharge Information Details Patient Name: Date of Service: [name] [name], [name] [name] [place] 11/18/2022 1:00 PM Medical Record Number: [id number] Patient Account Number: [id number] Date of Birth/Sex: Treating RN: November 18, 1970 (28 [name] Primary Care Traci Plemons: Einar Crow Other Clinician: Referring  Tempie Gibeault: Treating Fadil Macmaster/Extender: Johny Shears in Treatment: 2 Encounter Discharge Information Items Discharge Condition: Stable Ambulatory Status: Ambulatory Discharge Destination: Home Transportation: Private Auto Accompanied By: self Schedule Follow-up Appointment: Yes Clinical Summary of Care: Electronic Signature(s) Signed: 11/18/2022 2:53:31 PM By: Angelina Pih Entered By: Angelina Pih on 11/18/2022 11:53:31 -------------------------------------------------------------------------------- Lower Extremity Assessment Details Patient Name: Date of Service: [name] [name], [place] 11/18/2022 1:00 PM Medical Record Number: [id number] Patient Account Number: [id number] Date of Birth/Sex: Treating RN: 1970-02-21 (63 [name] Primary Care Darenda Fike: Einar Crow Other Clinician: Mellody Life ([id number]) [id number] Page 4 of 12 Referring Payden Bonus: Treating Kaylina Cahue/Extender: Johny Shears in Treatment: 2 Edema Assessment Assessed: [Left: No] [Right: No] Edema: [Left: Ye] [Right: s] Calf Left: Right: Point of Measurement: 39 cm From Medial Instep 58 cm Ankle Left: Right: Point of Measurement: 12 cm From Medial Instep 32 cm Vascular Assessment Pulses: Dorsalis Pedis Doppler Audible: [Left:Yes] Posterior Tibial Doppler Audible: [Left:Yes] Extremity colors, hair growth, and conditions: Extremity Color: [Left:Normal] Hair Growth on Extremity: [Left:No] Temperature of Extremity: [Left:Warm < 3 seconds] Toe Nail Assessment Left: Right: Thick: Yes Discolored: Yes Deformed: No Improper Length and Hygiene: Yes Electronic Signature(s) Signed: 11/18/2022 4:29:52 PM By: Angelina Pih Entered By: Angelina Pih on 11/18/2022 10:27:37 -------------------------------------------------------------------------------- Multi Wound Chart Details Patient Name: Date of  Service: [name] [name], [name] [name] [place] 11/18/2022 1:00 PM Medical Record Number: [id number] Patient Account Number: [id number] Date of Birth/Sex: Treating RN: 1970-05-20 (55 [name] Primary Care Jenisis Harmsen: Einar Crow Other Clinician: Referring Kiva Norland: Treating Catherine Oak/Extender: Johny Shears in Treatment: 2 Vital Signs Height(in): 74 Pulse(bpm): 86 Weight(lbs): 546 Blood Pressure(mmHg): 193/134 Body Mass Index(BMI): 70.1 Temperature(F): 98.4 Respiratory Rate(breaths/min): 18 [1:Photos:] [3:[id number] Page 5 of 12] Left, Plantar Foot Left, Anterior Lower Leg Left, Lateral Lower Leg Wound Location: Gradually Appeared Gradually Appeared Gradually Appeared Wounding  Event: Diabetic Wound/Ulcer of the Lower Diabetic Wound/Ulcer of the Lower Diabetic Wound/Ulcer of the Lower Primary Etiology: Extremity Extremity Extremity Hypertension, Type II Diabetes, Gout Hypertension, Type II Diabetes, Gout Hypertension, Type II Diabetes, Gout Comorbid History: 08/19/2022 08/19/2022 08/19/2022 Date Acquired: 2 2 2  Weeks of Treatment: Open Open Open Wound Status: No No No Wound Recurrence: 1x1.3x0.2 0.4x0.2x0.1 1.6x1.5x0.2 Measurements L x W x D (cm) 1.021 0.063 1.885 A (cm) : rea 0.204 0.006 0.377 Volume (cm) : -35.40% 95.90% -33.30% % Reduction in A rea: 9.70% 96.10% -33.20% % Reduction in Volume: 10 Starting Position 1 (o'clock): 2 Ending Position 1 (o'clock): 0.3 Maximum Distance 1 (cm): Yes No No Undermining: Grade 1 Grade 1 Grade 1 Classification: Medium Medium Medium Exudate A mount: Serosanguineous Serosanguineous Serosanguineous Exudate Type: red, brown red, brown red, brown Exudate Color: Large (67-100%) Large (67-100%) Small (1-33%) Granulation A mount: Red Red Red Granulation Quality: Small (1-33%) None Present (0%) Large (67-100%) Necrotic A mount: Fat Layer (Subcutaneous Tissue): Yes Fat Layer  (Subcutaneous Tissue): Yes Fat Layer (Subcutaneous Tissue): Yes Exposed Structures: None Small (1-33%) None Epithelialization: Treatment Notes Electronic Signature(s) Signed: 11/18/2022 4:29:52 PM By: Angelina Pih Entered By: Angelina Pih on 11/18/2022 10:59:16 -------------------------------------------------------------------------------- Multi-Disciplinary Care Plan Details Patient Name: Date of Service: [name] [name], [name] [name] [place] 11/18/2022 1:00 PM Medical Record Number: [id number] Patient Account Number: [id number] Date of Birth/Sex: Treating RN: [birth year] (79 [name] Primary Care Shrey Boike: Einar Crow Other Clinician: Referring Estell Puccini: Treating Strider Vallance/Extender: Johny Shears in Treatment: 2 Active Inactive Necrotic Tissue Nursing Diagnoses: Impaired tissue integrity related to necrotic/devitalized tissue Knowledge deficit related to management of necrotic/devitalized tissue Goals: Necrotic/devitalized tissue will be minimized in the wound bed [name], [name] ([id number]) [id number] Page 6 of 12 Date Initiated: 11/01/2022 Target Resolution Date: 12/27/2022 Goal Status: Active Patient/caregiver will verbalize understanding of reason and process for debridement of necrotic tissue Date Initiated: 11/01/2022 Date Inactivated: 11/01/2022 Target Resolution Date: 11/01/2022 Goal Status: Met Interventions: Assess patient pain level pre-, during and post procedure and prior to discharge Provide education on necrotic tissue and debridement process Treatment Activities: Apply topical anesthetic as ordered : 11/01/2022 Excisional debridement : 11/01/2022 Notes: Wound/Skin Impairment Nursing Diagnoses: Impaired tissue integrity Knowledge deficit related to ulceration/compromised skin integrity Goals: Ulcer/skin breakdown will have a volume reduction of 30% by week 4 Date Initiated: 11/01/2022 Target  Resolution Date: 11/29/2022 Goal Status: Active Ulcer/skin breakdown will have a volume reduction of 50% by week 8 Date Initiated: 11/01/2022 Target Resolution Date: 12/27/2022 Goal Status: Active Ulcer/skin breakdown will have a volume reduction of 80% by week 12 Date Initiated: 11/01/2022 Target Resolution Date: 01/24/2023 Goal Status: Active Ulcer/skin breakdown will heal within 14 weeks Date Initiated: 11/01/2022 Target Resolution Date: 02/07/2023 Goal Status: Active Interventions: Assess patient/caregiver ability to obtain necessary supplies Assess patient/caregiver ability to perform ulcer/skin care regimen upon admission and as needed Assess ulceration(s) every visit Provide education on ulcer and skin care Notes: Electronic Signature(s) Signed: 11/18/2022 4:29:52 PM By: Angelina Pih Entered By: Angelina Pih on 11/18/2022 11:01:01 -------------------------------------------------------------------------------- Pain Assessment Details Patient Name: Date of Service: [name] [name], [name] [name] [place] 11/18/2022 1:00 PM Medical Record Number: [id number] Patient Account Number: [id number] Date of Birth/Sex: Treating RN: 1970-10-04 (64 [name] Primary Care Cyrene Gharibian: Einar Crow Other Clinician: Referring Jenniferann Stuckert: Treating Kashmir Lysaght/Extender: Johny Shears in Treatment: 2 Active Problems Location of Pain Severity and Description of Pain Patient Has Paino No Site Locations [name], [name] ([id number])  [id number] Page 7 of 12 Site Locations Rate the pain. Current Pain Level: 0 Pain Management and Medication Current Pain Management: Electronic Signature(s) Signed: 11/18/2022 4:29:52 PM By: Angelina Pih Entered By: Angelina Pih on 11/18/2022 10:15:36 -------------------------------------------------------------------------------- Patient/Caregiver Education Details Patient Name: Date of Service: [name] Heide Spark [name], [name] [name] [place] 11/14/2024andnbsp1:00 PM Medical Record Number: [id number] Patient Account Number: [id number] Date of Birth/Gender: Treating RN: December 16, 1970 (89 [name] Primary Care Physician: Einar Crow Other Clinician: Referring Physician: Treating Physician/Extender: Johny Shears in Treatment: 2 Education Assessment Education Provided To: Patient Education Topics Provided Wound/Skin Impairment: Electronic Signature(s) Signed: 11/18/2022 4:29:52 PM By: Angelina Pih Entered By: Angelina Pih on 11/18/2022 11:01:07 Mellody Life ([id number]) [id number] Page 8 of 12 -------------------------------------------------------------------------------- Wound Assessment Details Patient Name: Date of Service: [name] [name],  [place] 11/18/2022 1:00 PM Medical Record Number: [id number] Patient Account Number: [id number] Date of Birth/Sex: Treating RN: [birth year] (86 [name] Primary Care Armani Gawlik: Einar Crow Other Clinician: Referring Nataleigh Griffin: Treating Darianne Muralles/Extender: Johny Shears in Treatment: 2 Wound Status Wound Number: 1 Primary Etiology: Diabetic Wound/Ulcer of the Lower Extremity Wound Location: Left, Plantar Foot Wound Status: Open Wounding Event: Gradually Appeared Comorbid History: Hypertension, Type II Diabetes, Gout Date Acquired: 08/19/2022 Weeks Of Treatment: 2 Clustered Wound: No Photos Wound Measurements Length: (cm) 1 Width: (cm) 1.3 Depth: (cm) 0.2 Area: (cm) 1.021 Volume: (cm) 0.204 % Reduction in Area: -35.4% % Reduction in Volume: 9.7% Epithelialization: None Tunneling: No Undermining: Yes Starting Position (o'clock): 10 Ending Position (o'clock): 2 Maximum Distance: (cm) 0.3 Wound Description Classification: Grade 1 Exudate Amount: Medium Exudate Type: Serosanguineous Exudate Color: red, brown Foul Odor After Cleansing:  No Slough/Fibrino Yes Wound Bed Granulation Amount: Large (67-100%) Exposed Structure Granulation Quality: Red Fat Layer (Subcutaneous Tissue) Exposed: Yes Necrotic Amount: Small (1-33%) Necrotic Quality: Adherent Slough Treatment Notes Wound #1 (Foot) Wound Laterality: Plantar, Left Cleanser Byram Ancillary Kit - 15 Day Supply Discharge Instruction: Use supplies as instructed; Kit contains: (15) Saline Bullets; (15) 3x3 Gauze; 15 pr Gloves [name], [name] ([id number]) [id number] Page 9 of 12 Soap and Water Discharge Instruction: Gently cleanse wound with antibacterial soap, rinse and pat dry prior to dressing wounds Wound Cleanser Discharge Instruction: Wash your hands with soap and water. Remove old dressing, discard into plastic bag and place into trash. Cleanse the wound with Wound Cleanser prior to applying a clean dressing using gauze sponges, not tissues or cotton balls. Do not scrub or use excessive force. Pat dry using gauze sponges, not tissue or cotton balls. Peri-Wound Care Topical Primary Dressing Hydrofera Blue Ready Transfer Foam, 2.5x2.5 (in/in) Discharge Instruction: Apply Hydrofera Blue Ready to wound bed as directed Secondary Dressing (BORDER) Zetuvit Plus SILICONE BORDER Dressing 4x4 (in/in) Discharge Instruction: Please do not put silicone bordered dressings under wraps. Use non-bordered dressing only. Secured With Compression Wrap Compression Stockings Add-Ons Electronic Signature(s) Signed: 11/18/2022 4:29:52 PM By: Angelina Pih Entered By: Angelina Pih on 11/18/2022 10:26:20 -------------------------------------------------------------------------------- Wound Assessment Details Patient Name: Date of Service: [name] [name], [name] [name] [place] 11/18/2022 1:00 PM Medical Record Number: [id number] Patient Account Number: [id number] Date of Birth/Sex: Treating RN: May 11, 1970 (28 [name] Primary Care Deasha Clendenin: Einar Crow Other Clinician: Referring Hydia Copelin: Treating Bedie Dominey/Extender: Johny Shears in Treatment: 2 Wound Status Wound Number: 2 Primary Etiology: Diabetic Wound/Ulcer of the Lower Extremity Wound Location: Left, Anterior Lower Leg Wound Status: Open Wounding Event: Gradually Appeared Comorbid History: Hypertension,  Type II Diabetes, Gout Date Acquired: 08/19/2022 Weeks Of Treatment: 2 Clustered Wound: No Photos Wound Measurements [name], [name] ([id number]) Length: (cm) 0.4 Width: (cm) 0.2 Depth: (cm) 0.1 Area: (cm) 0.063 Volume: (cm) 0.006 [id number] Page 10 of 12 % Reduction in Area: 95.9% % Reduction in Volume: 96.1% Epithelialization: Small (1-33%) Tunneling: No Undermining: No Wound Description Classification: Grade 1 Exudate Amount: Medium Exudate Type: Serosanguineous Exudate Color: red, brown Foul Odor After Cleansing: No Slough/Fibrino Yes Wound Bed Granulation Amount: Large (67-100%) Exposed Structure Granulation Quality: Red Fat Layer (Subcutaneous Tissue) Exposed: Yes Necrotic Amount: None Present (0%) Treatment Notes Wound #2 (Lower Leg) Wound Laterality: Left, Anterior Cleanser Byram Ancillary Kit - 15 Day Supply Discharge Instruction: Use supplies as instructed; Kit contains: (15) Saline Bullets; (15) 3x3 Gauze; 15 pr Gloves Soap and Water Discharge Instruction: Gently cleanse wound with antibacterial soap, rinse and pat dry prior to dressing wounds Wound Cleanser Discharge Instruction: Wash your hands with soap and water. Remove old dressing, discard into plastic bag and place into trash. Cleanse the wound with Wound Cleanser prior to applying a clean dressing using gauze sponges, not tissues or cotton balls. Do not scrub or use excessive force. Pat dry using gauze sponges, not tissue or cotton balls. Peri-Wound Care Topical Primary Dressing Hydrofera Blue Ready Transfer Foam, 2.5x2.5  (in/in) Discharge Instruction: Apply Hydrofera Blue Ready to wound bed as directed Secondary Dressing (BORDER) Zetuvit Plus SILICONE BORDER Dressing 4x4 (in/in) Discharge Instruction: Please do not put silicone bordered dressings under wraps. Use non-bordered dressing only. Secured With Compression Wrap Compression Stockings Add-Ons Electronic Signature(s) Signed: 11/18/2022 4:29:52 PM By: Angelina Pih Entered By: Angelina Pih on 11/18/2022 10:26:44 -------------------------------------------------------------------------------- Wound Assessment Details Patient Name: Date of Service: [name] [name], [name] [name] [place] 11/18/2022 1:00 PM Medical Record Number: [id number] Patient Account Number: [id number] Date of Birth/Sex: Treating RN: [birth year] (67 [name] Primary Care Jannifer Fischler: Einar Crow Other Clinician: Mellody Life ([id number]) [id number] Page 11 of 12 Referring Emogene Muratalla: Treating [place] Lamons/Extender: Johny Shears in Treatment: 2 Wound Status Wound Number: 3 Primary Etiology: Diabetic Wound/Ulcer of the Lower Extremity Wound Location: Left, Lateral Lower Leg Wound Status: Open Wounding Event: Gradually Appeared Comorbid History: Hypertension, Type II Diabetes, Gout Date Acquired: 08/19/2022 Weeks Of Treatment: 2 Clustered Wound: No Photos Wound Measurements Length: (cm) 1.6 Width: (cm) 1.5 Depth: (cm) 0.2 Area: (cm) 1.885 Volume: (cm) 0.377 % Reduction in Area: -33.3% % Reduction in Volume: -33.2% Epithelialization: None Tunneling: No Undermining: No Wound Description Classification: Grade 1 Exudate Amount: Medium Exudate Type: Serosanguineous Exudate Color: red, brown Foul Odor After Cleansing: No Slough/Fibrino Yes Wound Bed Granulation Amount: Small (1-33%) Exposed Structure Granulation Quality: Red Fat Layer (Subcutaneous Tissue) Exposed: Yes Necrotic Amount: Large (67-100%) Necrotic  Quality: Adherent Slough Treatment Notes Wound #3 (Lower Leg) Wound Laterality: Left, Lateral Cleanser Byram Ancillary Kit - 15 Day Supply Discharge Instruction: Use supplies as instructed; Kit contains: (15) Saline Bullets; (15) 3x3 Gauze; 15 pr Gloves Soap and Water Discharge Instruction: Gently cleanse wound with antibacterial soap, rinse and pat dry prior to dressing wounds Wound Cleanser Discharge Instruction: Wash your hands with soap and water. Remove old dressing, discard into plastic bag and place into trash. Cleanse the wound with Wound Cleanser prior to applying a clean dressing using gauze sponges, not tissues or cotton balls. Do not scrub or use excessive force. Pat dry using gauze sponges, not tissue or cotton balls. Peri-Wound Care Topical Primary Dressing Hydrofera Blue Ready Transfer Foam, 2.5x2.5 (  in/in) Discharge Instruction: Apply Hydrofera Blue Ready to wound bed as directed Secondary Dressing (BORDER) Zetuvit Plus SILICONE BORDER Dressing 4x4 (in/in) Discharge Instruction: Please do not put silicone bordered dressings under wraps. Use non-bordered dressing only. Secured With Compression [name], SW[place] ([id number]) [id number] Page 12 of 12 Compression Stockings Add-Ons Electronic Signature(s) Signed: 11/18/2022 4:29:52 PM By: Angelina Pih Entered By: Angelina Pih on 11/18/2022 10:27:07 -------------------------------------------------------------------------------- Vitals Details Patient Name: Date of Service: [name] [name], [name] [name] [place] 11/18/2022 1:00 PM Medical Record Number: [id number] Patient Account Number: [id number] Date of Birth/Sex: Treating RN: 1970/12/23 (86 [name] Primary Care Lourdes Manning: Einar Crow Other Clinician: Referring Zuriel Roskos: Treating Keyion Knack/Extender: Johny Shears in Treatment: 2 Vital Signs Time Taken: 13:14 Temperature (F): 98.4 Height (in):  74 Pulse (bpm): 86 Weight (lbs): 546 Respiratory Rate (breaths/min): 18 Body Mass Index (BMI): 70.1 Blood Pressure (mmHg): 193/134 Reference Range: 80 - 120 mg / dl Notes pt states asymptomatic, most recent BP at PCP was 123/78, PA Stone made aware Electronic Signature(s) Signed: 11/18/2022 2:52:52 PM By: Angelina Pih Entered By: Angelina Pih on 11/18/2022 11:52:51

## 2022-11-18 NOTE — Progress Notes (Addendum)
LAWRENC, FARRER ([id number]) [id number] Page 1 of 8 Visit Report for 11/18/2022 Chief Complaint Document Details Patient Name: Date of Service: [name] [name], [place] 11/18/2022 1:00 PM Medical Record Number: [id number] Patient Account Number: [id number] Date of Birth/Sex: Treating RN: 11-Apr-1970 (52 y.o. Laymond Purser Primary Care Provider: Einar Crow Other Clinician: Referring Provider: Treating Provider/Extender: Johny Shears in Treatment: 2 Information Obtained from: Patient Chief Complaint Left foot and Left LE Ulcers Electronic Signature(s) Signed: 11/18/2022 1:51:26 PM By: Allen Derry PA-C Entered By: Allen Derry on 11/18/2022 13:51:26 -------------------------------------------------------------------------------- HPI Details Patient Name: Date of Service: [name] [name], [place] 11/18/2022 1:00 PM Medical Record Number: [id number] Patient Account Number: [id number] Date of Birth/Sex: Treating RN: 10/20/1970 (52 y.o. Laymond Purser Primary Care Provider: Einar Crow Other Clinician: Referring Provider: Treating Provider/Extender: Johny Shears in Treatment: 2 History of Present Illness Chronic/Inactive Conditions Condition 1: 11-01-2022 on evaluation today patient's ABIs were unfortunately noncompressible bilaterally though he has good pulses 1+ equal bilaterally when I palpated and the reason I could not feel them better it appears as because of the fact that he is having a lot of issues with swelling and again due to his size in general. Nonetheless his capillary refill was actually less than a second and it filled really quickly after testing and I feel like that his extremities were nice and warm he had hair growth as well in the lower extremities I do not think he needs to go for formal evaluation at this point. HPI Description: 11-01-2022 upon evaluation today patient's  wound bed actually showed signs of having multiple wounds over the left lower extremity 2 in fact on the leg 1 on the plantar foot. He tells me he does not really know how long the foot is been present although he states his wife noted it 2 weeks ago. Obviously looking at this wound I think this is much longer than 2 weeks although he does not have feeling he is a diabetic type II with diabetic neuropathy and this subsequently means that he does not really know exactly when this occurred. He is not having any pain obviously at this point in the leg ulcers he tells me started out as blisters and again the foot he really does not know how that started. He does have a significant amount of callus buildup in the foot region. Patient does have a history as noted above diabetes mellitus type 2 with a hemoglobin A1c of 7.8 that was on October 25, 2022. He also has a history of hypertension and obesity but no other major medical problems that would particularly contribute to this. Well currently in regard to 11-09-2022 upon evaluation today patient appears to be doing his wounds as far as being stable for the most part although the left anterior leg actually showed signs of cellulitis this is not doing as well as I like to see it. Has not been using Tubigrip quite right. I think we need to try to get this infection under control before all else. He voiced understanding. With that being said I do think that at this point we are going to mark where the erythema RYLEIGH, BEISEL ([id number]) [id number] Page 2 of 8 redness is visually and tactile we are going to monitor and make sure that this does not spread if it does he needs to go to the ER soon as possible for now I am going to send in a  prescription for Levaquin for him which I think is gena be the best way to go and he is in agreement with that plan. 11-18-2022 upon evaluation today patient appears to be doing decently well  currently in regard to his leg. With that being said I feel like the erythema is a little bit better were not completely resolved yet but I definitely see signs of improvement compared to where we were. Fortunately I do not see any evidence of active infection systemically which is great news. Electronic Signature(s) Signed: 11/18/2022 2:22:10 PM By: Allen Derry PA-C Entered By: Allen Derry on 11/18/2022 14:22:10 -------------------------------------------------------------------------------- Physical Exam Details Patient Name: Date of Service: [name] [name], [place] 11/18/2022 1:00 PM Medical Record Number: [id number] Patient Account Number: [id number] Date of Birth/Sex: Treating RN: 1970-09-17 (52 y.o. Laymond Purser Primary Care Provider: Einar Crow Other Clinician: Referring Provider: Treating Provider/Extender: Johny Shears in Treatment: 2 Constitutional Obese and well-hydrated in no acute distress. Respiratory normal breathing without difficulty. Psychiatric this patient is able to make decisions and demonstrates good insight into disease process. Alert and Oriented x 3. pleasant and cooperative. Notes Upon inspection patient's wound bed actually showed signs of good granulation epithelization at this point. Fortunately I do not see any evidence of worsening overall and I believe that the patient is making good headway. T owards closure which is good news. The legs especially look good. The foot is roughly about the same but at the same time I think that getting this under control from the standpoint of infection is her primary goal in the foot skin to be what we focus on most significantly following. I am still waiting on the results back from the x-ray read although I did read it myself just looking at the pictures today I did not see anything obvious that look to be destructive as far as the bony structure was concerned and the area in question  at the distal metatarsal. With that being said I will await the final x-ray read from the radiologist. Electronic Signature(s) Signed: 11/18/2022 2:23:37 PM By: Allen Derry PA-C Entered By: Allen Derry on 11/18/2022 14:23:37 -------------------------------------------------------------------------------- Physician Orders Details Patient Name: Date of Service: [name] [name], [place] 11/18/2022 1:00 PM Medical Record Number: [id number] Patient Account Number: [id number] Date of Birth/Sex: Treating RN: June 29, 1970 (52 y.o. Laymond Purser Primary Care Provider: Einar Crow Other Clinician: Referring Provider: Treating Provider/Extender: Alvino Chapel [place], Jerilynn Som ([id number]) [id number] Page 3 of 8 Weeks in Treatment: 2 The following information was scribed by: Angelina Pih The information was scribed for: Allen Derry Verbal / Phone Orders: No Diagnosis Coding ICD-10 Coding Code Description E11.621 Type 2 diabetes mellitus with foot ulcer L97.522 Non-pressure chronic ulcer of other part of left foot with fat layer exposed I87.332 Chronic venous hypertension (idiopathic) with ulcer and inflammation of left lower extremity E11.622 Type 2 diabetes mellitus with other skin ulcer L97.822 Non-pressure chronic ulcer of other 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 Bathing/ Shower/ Hygiene May shower; gently cleanse wound with antibacterial soap, rinse and pat dry prior to dressing wounds No tub bath. Anesthetic (Use 'Patient Medications' Section for Anesthetic Order Entry) Lidocaine applied to wound bed Edema Control - Orders / Instructions Tubigrip single layer applied. - E Elevate, Exercise Daily and A void Standing for Long Periods of Time. Elevate leg(s) parallel to the floor when sitting.  DO YOUR BEST to sleep in the bed at  night. DO NOT sleep in your recliner. Long hours of sitting in a recliner leads to swelling of the legs and/or potential wounds on your backside. Off-Loading Open toe surgical shoe with peg assist. - left foot Other: - dial soap original gold recommended Medications-Please add to medication list. ntibiotics - continue and finish as prescribed P.O. A Wound Treatment Wound #1 - Foot Wound Laterality: Plantar, Left Cleanser: Byram Ancillary Kit - 15 Day Supply (Generic) Every Other Day/30 Days Discharge Instructions: Use supplies as instructed; Kit contains: (15) Saline Bullets; (15) 3x3 Gauze; 15 pr Gloves Cleanser: Soap and Water Every Other Day/30 Days Discharge Instructions: Gently cleanse wound with antibacterial soap, rinse and pat dry prior to dressing wounds Cleanser: Wound Cleanser Every Other Day/30 Days Discharge Instructions: Wash your hands with soap and water. Remove old dressing, discard into plastic bag and place into trash. Cleanse the wound with Wound Cleanser prior to applying a clean dressing using gauze sponges, not tissues or cotton balls. Do not scrub or use excessive force. Pat dry using gauze sponges, not tissue or cotton balls. Prim Dressing: Hydrofera Blue Ready Transfer Foam, 2.5x2.5 (in/in) Every Other Day/30 Days ary Discharge Instructions: Apply Hydrofera Blue Ready to wound bed as directed Secondary Dressing: (BORDER) Zetuvit Plus SILICONE BORDER Dressing 4x4 (in/in) (Generic) Every Other Day/30 Days Discharge Instructions: Please do not put silicone bordered dressings under wraps. Use non-bordered dressing only. Wound #2 - Lower Leg Wound Laterality: Left, Anterior Cleanser: Byram Ancillary Kit - 15 Day Supply Every Other Day/30 Days Discharge Instructions: Use supplies as instructed; Kit contains: (15) Saline Bullets; (15) 3x3 Gauze; 15 pr Gloves Cleanser: Soap and Water Every Other Day/30 Days Discharge Instructions: Gently cleanse wound with antibacterial  soap, rinse and pat dry prior to dressing wounds Cleanser: Wound Cleanser Every Other Day/30 Days Discharge Instructions: Wash your hands with soap and water. Remove old dressing, discard into plastic bag and place into trash. Cleanse the wound with Wound Cleanser prior to applying a clean dressing using gauze sponges, not tissues or cotton balls. Do not scrub or use excessive force. Pat dry using gauze sponges, not tissue or cotton balls. Prim Dressing: Hydrofera Blue Ready Transfer Foam, 2.5x2.5 (in/in) Every Other Day/30 Days ary Discharge Instructions: Apply Hydrofera Blue Ready to wound bed as directed Secondary Dressing: (BORDER) Zetuvit Plus SILICONE BORDER Dressing 4x4 (in/in) (Generic) Every Other Day/30 Days Discharge Instructions: Please do not put silicone bordered dressings under wraps. Use non-bordered dressing only. IDAN, NAZARI ([id number]) [id number] Page 4 of 8 Wound #3 - Lower Leg Wound Laterality: Left, Lateral Cleanser: Byram Ancillary Kit - 15 Day Supply Every Other Day/30 Days Discharge Instructions: Use supplies as instructed; Kit contains: (15) Saline Bullets; (15) 3x3 Gauze; 15 pr Gloves Cleanser: Soap and Water Every Other Day/30 Days Discharge Instructions: Gently cleanse wound with antibacterial soap, rinse and pat dry prior to dressing wounds Cleanser: Wound Cleanser Every Other Day/30 Days Discharge Instructions: Wash your hands with soap and water. Remove old dressing, discard into plastic bag and place into trash. Cleanse the wound with Wound Cleanser prior to applying a clean dressing using gauze sponges, not tissues or cotton balls. Do not scrub or use excessive force. Pat dry using gauze sponges, not tissue or cotton balls. Prim Dressing: Hydrofera Blue Ready Transfer Foam, 2.5x2.5 (in/in) Every Other Day/30 Days ary Discharge Instructions: Apply Hydrofera Blue Ready to wound bed as directed Secondary Dressing: (BORDER)  Zetuvit Plus SILICONE BORDER  Dressing 4x4 (in/in) (Generic) Every Other Day/30 Days Discharge Instructions: Please do not put silicone bordered dressings under wraps. Use non-bordered dressing only. Electronic Signature(s) Signed: 11/18/2022 4:29:52 PM By: Angelina Pih Signed: 11/18/2022 5:33:14 PM By: Allen Derry PA-C Entered By: Angelina Pih on 11/18/2022 14:00:19 -------------------------------------------------------------------------------- Problem List Details Patient Name: Date of Service: [name] [name] 11/18/2022 1:00 PM Medical Record Number: [id number] Patient Account Number: [id number] Date of Birth/Sex: Treating RN: 1970-09-27 (52 y.o. Laymond Purser Primary Care Provider: Einar Crow Other Clinician: Referring Provider: Treating Provider/Extender: Johny Shears in Treatment: 2 Active Problems ICD-10 Encounter Code Description Active Date MDM Diagnosis E11.621 Type 2 diabetes mellitus with foot ulcer 11/01/2022 No Yes L97.522 Non-pressure chronic ulcer of other part of left foot with fat layer exposed 11/01/2022 No Yes I87.332 Chronic venous hypertension (idiopathic) with ulcer and inflammation of left 11/01/2022 No Yes lower extremity E11.622 Type 2 diabetes mellitus with other skin ulcer 11/01/2022 No Yes L97.822 Non-pressure chronic ulcer of other part of left lower leg with fat layer 11/01/2022 No Yes exposed Rexrode, Alfonso ([id number]) [id number] Page 5 of 8 I10 Essential (primary) hypertension 11/01/2022 No Yes Inactive Problems Resolved Problems Electronic Signature(s) Signed: 11/18/2022 1:51:18 PM By: Allen Derry PA-C Entered By: Allen Derry on 11/18/2022 13:51:18 -------------------------------------------------------------------------------- Progress Note Details Patient Name: Date of Service: [place], [place] 11/18/2022 1:00 PM Medical Record Number: [id number] Patient  Account Number: [id number] Date of Birth/Sex: Treating RN: 08-19-1970 (52 y.o. Laymond Purser Primary Care Provider: Einar Crow Other Clinician: Referring Provider: Treating Provider/Extender: Johny Shears in Treatment: 2 Subjective Chief Complaint Information obtained from Patient Left foot and Left LE Ulcers History of Present Illness (HPI) Chronic/Inactive Condition: 11-01-2022 on evaluation today patient's ABIs were unfortunately noncompressible bilaterally though he has good pulses 1+ equal bilaterally when I palpated and the reason I could not feel them better it appears as because of the fact that he is having a lot of issues with swelling and again due to his size in general. Nonetheless his capillary refill was actually less than a second and it filled really quickly after testing and I feel like that his extremities were nice and warm he had hair growth as well in the lower extremities I do not think he needs to go for formal evaluation at this point. 11-01-2022 upon evaluation today patient's wound bed actually showed signs of having multiple wounds over the left lower extremity 2 in fact on the leg 1 on the plantar foot. He tells me he does not really know how long the foot is been present although he states his wife noted it 2 weeks ago. Obviously looking at this wound I think this is much longer than 2 weeks although he does not have feeling he is a diabetic type II with diabetic neuropathy and this subsequently means that he does not really know exactly when this occurred. He is not having any pain obviously at this point in the leg ulcers he tells me started out as blisters and again the foot he really does not know how that started. He does have a significant amount of callus buildup in the foot region. Patient does have a history as noted above diabetes mellitus type 2 with a hemoglobin A1c of 7.8 that was on October 25, 2022. He also has a  history of hypertension and obesity but no other major medical problems that would particularly contribute to this. Well  currently in regard to 11-09-2022 upon evaluation today patient appears to be doing his wounds as far as being stable for the most part although the left anterior leg actually showed signs of cellulitis this is not doing as well as I like to see it. Has not been using Tubigrip quite right. I think we need to try to get this infection under control before all else. He voiced understanding. With that being said I do think that at this point we are going to mark where the erythema redness is visually and tactile we are going to monitor and make sure that this does not spread if it does he needs to go to the ER soon as possible for now I am going to send in a prescription for Levaquin for him which I think is gena be the best way to go and he is in agreement with that plan. 11-18-2022 upon evaluation today patient appears to be doing decently well currently in regard to his leg. With that being said I feel like the erythema is a little bit better were not completely resolved yet but I definitely see signs of improvement compared to where we were. Fortunately I do not see any evidence of active infection systemically which is great news. Objective Constitutional Obese and well-hydrated in no acute distress. ARTEM, HABERKORN ([id number]) [id number] Page 6 of 8 Vitals Time Taken: 1:14 PM, Height: 74 in, Weight: 546 lbs, BMI: 70.1, Temperature: 98.4 F, Pulse: 86 bpm, Respiratory Rate: 18 breaths/min, Blood Pressure: 193/134 mmHg. General Notes: pt states asymptomatic, most recent BP at PCP was 123/78 Respiratory normal breathing without difficulty. Psychiatric this patient is able to make decisions and demonstrates good insight into disease process. Alert and Oriented x 3. pleasant and cooperative. General Notes: Upon inspection patient's wound bed actually  showed signs of good granulation epithelization at this point. Fortunately I do not see any evidence of worsening overall and I believe that the patient is making good headway. T owards closure which is good news. The legs especially look good. The foot is roughly about the same but at the same time I think that getting this under control from the standpoint of infection is her primary goal in the foot skin to be what we focus on most significantly following. I am still waiting on the results back from the x-ray read although I did read it myself just looking at the pictures today I did not see anything obvious that look to be destructive as far as the bony structure was concerned and the area in question at the distal metatarsal. With that being said I will await the final x-ray read from the radiologist. Integumentary (Hair, Skin) Wound #1 status is Open. Original cause of wound was Gradually Appeared. The date acquired was: 08/19/2022. The wound has been in treatment 2 weeks. The wound is located on the Left,Plantar Foot. The wound measures 1cm length x 1.3cm width x 0.2cm depth; 1.021cm^2 area and 0.204cm^3 volume. There is Fat Layer (Subcutaneous Tissue) exposed. There is no tunneling noted, however, there is undermining starting at 10:00 and ending at 2:00 with a maximum distance of 0.3cm. There is a medium amount of serosanguineous drainage noted. There is large (67-100%) red granulation within the wound bed. There is a small (1-33%) amount of necrotic tissue within the wound bed including Adherent Slough. Wound #2 status is Open. Original cause of wound was Gradually Appeared. The date acquired was: 08/19/2022. The wound has been in treatment 2 weeks. The  wound is located on the Left,Anterior Lower Leg. The wound measures 0.4cm length x 0.2cm width x 0.1cm depth; 0.063cm^2 area and 0.006cm^3 volume. There is Fat Layer (Subcutaneous Tissue) exposed. There is no tunneling or undermining noted.  There is a medium amount of serosanguineous drainage noted. There is large (67-100%) red granulation within the wound bed. There is no necrotic tissue within the wound bed. Wound #3 status is Open. Original cause of wound was Gradually Appeared. The date acquired was: 08/19/2022. The wound has been in treatment 2 weeks. The wound is located on the Left,Lateral Lower Leg. The wound measures 1.6cm length x 1.5cm width x 0.2cm depth; 1.885cm^2 area and 0.377cm^3 volume. There is Fat Layer (Subcutaneous Tissue) exposed. There is no tunneling or undermining noted. There is a medium amount of serosanguineous drainage noted. There is small (1-33%) red granulation within the wound bed. There is a large (67-100%) amount of necrotic tissue within the wound bed including Adherent Slough. Assessment Active Problems ICD-10 Type 2 diabetes mellitus with foot ulcer Non-pressure chronic ulcer of other part of left foot with fat layer exposed Chronic venous hypertension (idiopathic) with ulcer and inflammation of left lower extremity Type 2 diabetes mellitus with other skin ulcer Non-pressure chronic ulcer of other part of left lower leg with fat layer exposed Essential (primary) hypertension Plan Follow-up Appointments: Return Appointment in 1 week. Bathing/ Shower/ Hygiene: May shower; gently cleanse wound with antibacterial soap, rinse and pat dry prior to dressing wounds No tub bath. Anesthetic (Use 'Patient Medications' Section for Anesthetic Order Entry): Lidocaine applied to wound bed Edema Control - Orders / Instructions: Tubigrip single layer applied. - E Elevate, Exercise Daily and Avoid Standing for Long Periods of Time. Elevate leg(s) parallel to the floor when sitting. DO YOUR BEST to sleep in the bed at night. DO NOT sleep in your recliner. Long hours of sitting in a recliner leads to swelling of the legs and/or potential wounds on your backside. Off-Loading: Open toe surgical shoe with peg  assist. - left foot Other: - dial soap original gold recommended Medications-Please add to medication list.: P.O. Antibiotics - continue and finish as prescribed WOUND #1: - Foot Wound Laterality: Plantar, Left Cleanser: Byram Ancillary Kit - 15 Day Supply (Generic) Every Other Day/30 Days Discharge Instructions: Use supplies as instructed; Kit contains: (15) Saline Bullets; (15) 3x3 Gauze; 15 pr Gloves Cleanser: Soap and Water Every Other Day/30 Days Discharge Instructions: Gently cleanse wound with antibacterial soap, rinse and pat dry prior to dressing wounds Cleanser: Wound Cleanser Every Other Day/30 Days Discharge Instructions: Wash your hands with soap and water. Remove old dressing, discard into plastic bag and place into trash. Cleanse the wound with Wound Cleanser prior to applying a clean dressing using gauze sponges, not tissues or cotton balls. Do not scrub or use excessive force. Pat dry using gauze sponges, not tissue or cotton balls. Prim Dressing: Hydrofera Blue Ready Transfer Foam, 2.5x2.5 (in/in) Every Other Day/30 Days ary Discharge Instructions: Apply Hydrofera Blue Ready to wound bed as directed Secondary Dressing: (BORDER) Zetuvit Plus SILICONE BORDER Dressing 4x4 (in/in) (Generic) Every Other Day/30 Days Discharge Instructions: Please do not put silicone bordered dressings under wraps. Use non-bordered dressing only. FRASIER, SPRINGBORN ([id number]) [id number] Page 7 of 8 WOUND #2: - Lower Leg Wound Laterality: Left, Anterior Cleanser: Byram Ancillary Kit - 15 Day Supply Every Other Day/30 Days Discharge Instructions: Use supplies as instructed; Kit contains: (15) Saline Bullets; (15) 3x3 Gauze; 15 pr Gloves Cleanser: Soap and Water  Every Other Day/30 Days Discharge Instructions: Gently cleanse wound with antibacterial soap, rinse and pat dry prior to dressing wounds Cleanser: Wound Cleanser Every Other Day/30 Days Discharge Instructions: Wash  your hands with soap and water. Remove old dressing, discard into plastic bag and place into trash. Cleanse the wound with Wound Cleanser prior to applying a clean dressing using gauze sponges, not tissues or cotton balls. Do not scrub or use excessive force. Pat dry using gauze sponges, not tissue or cotton balls. Prim Dressing: Hydrofera Blue Ready Transfer Foam, 2.5x2.5 (in/in) Every Other Day/30 Days ary Discharge Instructions: Apply Hydrofera Blue Ready to wound bed as directed Secondary Dressing: (BORDER) Zetuvit Plus SILICONE BORDER Dressing 4x4 (in/in) (Generic) Every Other Day/30 Days Discharge Instructions: Please do not put silicone bordered dressings under wraps. Use non-bordered dressing only. WOUND #3: - Lower Leg Wound Laterality: Left, Lateral Cleanser: Byram Ancillary Kit - 15 Day Supply Every Other Day/30 Days Discharge Instructions: Use supplies as instructed; Kit contains: (15) Saline Bullets; (15) 3x3 Gauze; 15 pr Gloves Cleanser: Soap and Water Every Other Day/30 Days Discharge Instructions: Gently cleanse wound with antibacterial soap, rinse and pat dry prior to dressing wounds Cleanser: Wound Cleanser Every Other Day/30 Days Discharge Instructions: Wash your hands with soap and water. Remove old dressing, discard into plastic bag and place into trash. Cleanse the wound with Wound Cleanser prior to applying a clean dressing using gauze sponges, not tissues or cotton balls. Do not scrub or use excessive force. Pat dry using gauze sponges, not tissue or cotton balls. Prim Dressing: Hydrofera Blue Ready Transfer Foam, 2.5x2.5 (in/in) Every Other Day/30 Days ary Discharge Instructions: Apply Hydrofera Blue Ready to wound bed as directed Secondary Dressing: (BORDER) Zetuvit Plus SILICONE BORDER Dressing 4x4 (in/in) (Generic) Every Other Day/30 Days Discharge Instructions: Please do not put silicone bordered dressings under wraps. Use non-bordered dressing only. #1 based on  what I am seeing I am going to recommend that the patient should continue currently with the [facility] Dt to be doing better the leg I think is improving. 2. Also can recommend we continue with the bordered foam dressing to cover for both leg wounds on the foot. 3. And also can recommend the patient should continue to elevate his leg is much as possible to try to help with edema control. The more that he can do as far as elevation is concerned the better. We will see patient back for reevaluation in 1 week here in the clinic. If anything worsens or changes patient will contact our office for additional recommendations. Electronic Signature(s) Signed: 11/18/2022 2:24:40 PM By: Allen Derry PA-C Entered By: Allen Derry on 11/18/2022 14:24:40 -------------------------------------------------------------------------------- SuperBill Details Patient Name: Date of Service: [place] L[place] Y, [place] 11/18/2022 Medical Record Number: [id number] Patient Account Number: [id number] Date of Birth/Sex: Treating RN: [birth year] (52 y.o. 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 Chronic venous hypertension (idiopathic) with ulcer and inflammation of left lower extremity E11.622 Type 2 diabetes mellitus with other skin ulcer L97.822 Non-pressure chronic ulcer of other part of left lower leg with fat layer exposed I10 Essential (primary) hypertension Facility Procedures : [place], 7 CPT4 Code: Hailey ([id number]) [id number] Description: [id number] - WOUND CARE VISIT-LEV 4 EST PT  Modifier: [id number].[id number] Page 1 Quantity: 8 of 8 Physician Procedures : CPT4 Code  Description Modifier [phone] [id number] - WC PHYS LEVEL 3 - EST PT ICD-10 Diagnosis Description E11.621 Type 2 diabetes mellitus with foot ulcer 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

## 2022-11-22 IMAGING — CR DG CHEST 2V
2 series · 3 of 3 positions shown · non-contrast
Comparison: None.

CLINICAL DATA: Suspected sepsis.

EXAM:
CHEST - 2 VIEW

[Series 2: chest lat · 0.14mm/px · 2 of 2 slices shown]
[im 1/2]
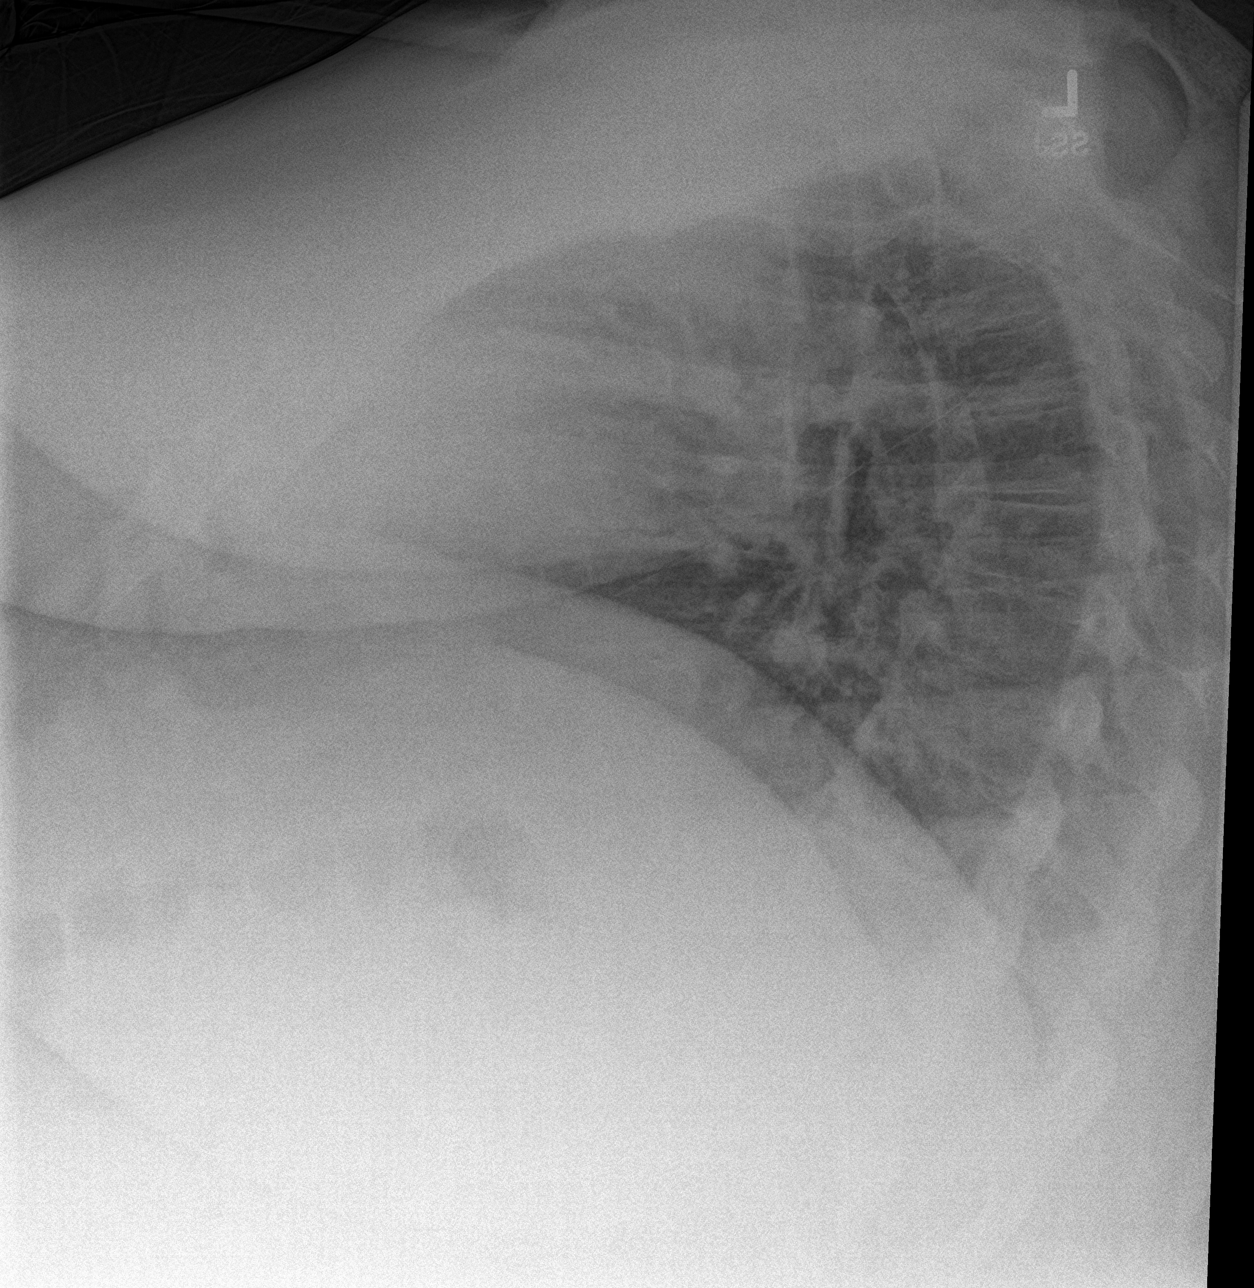
[im 2/2]
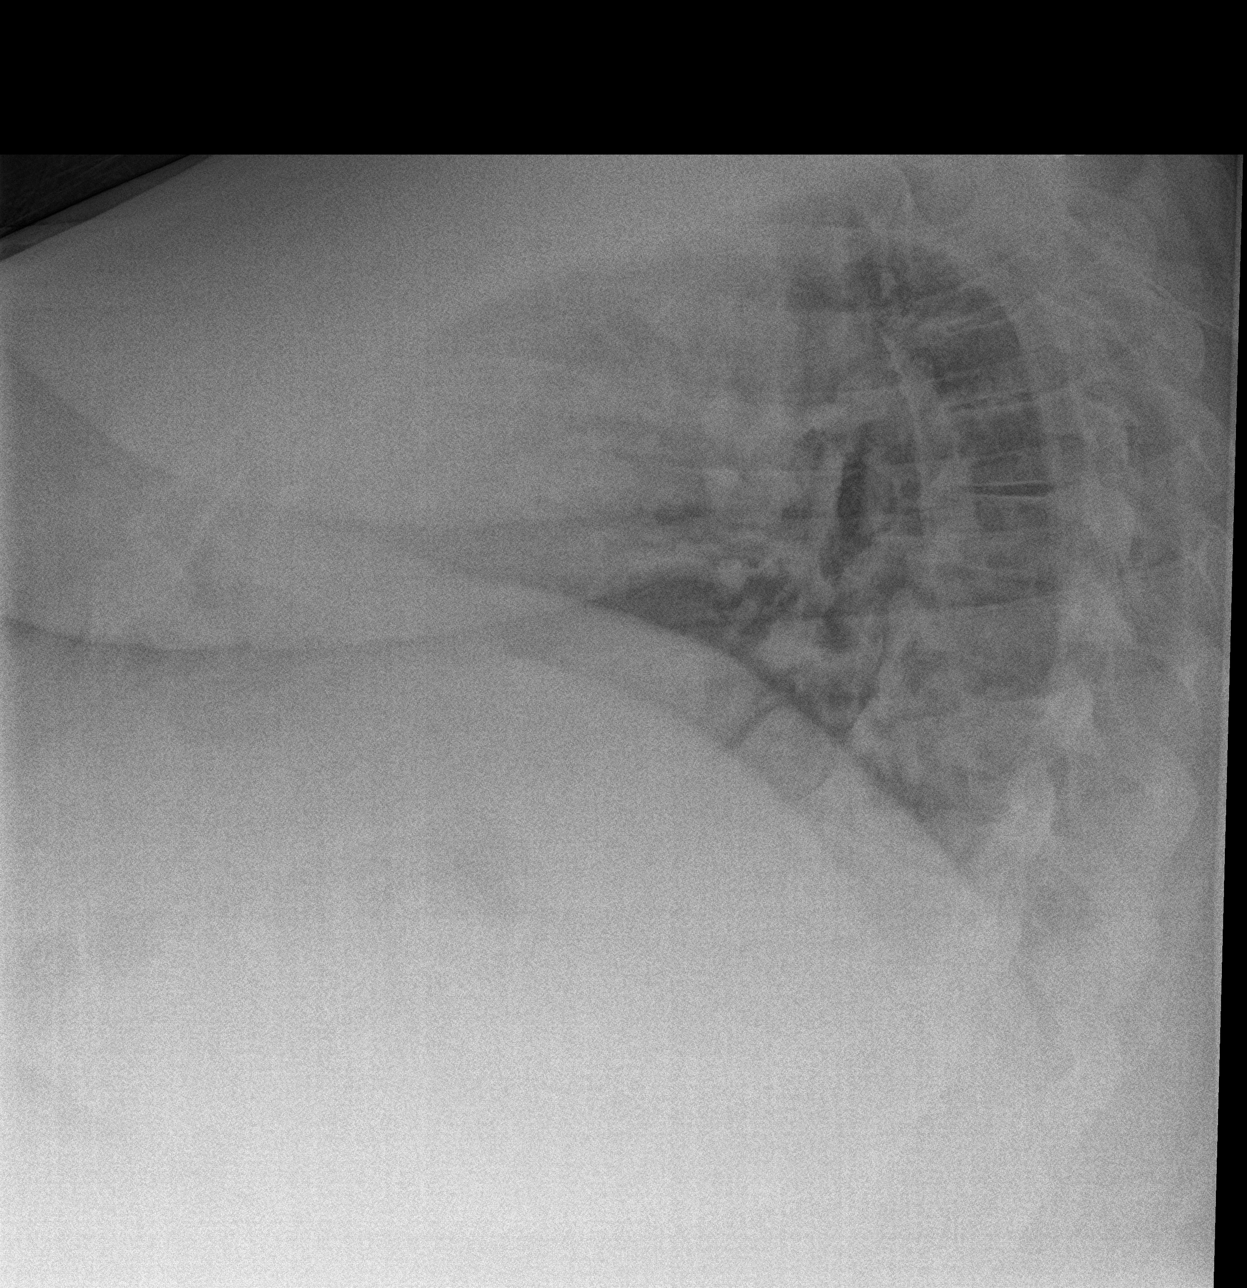

[chest ap]
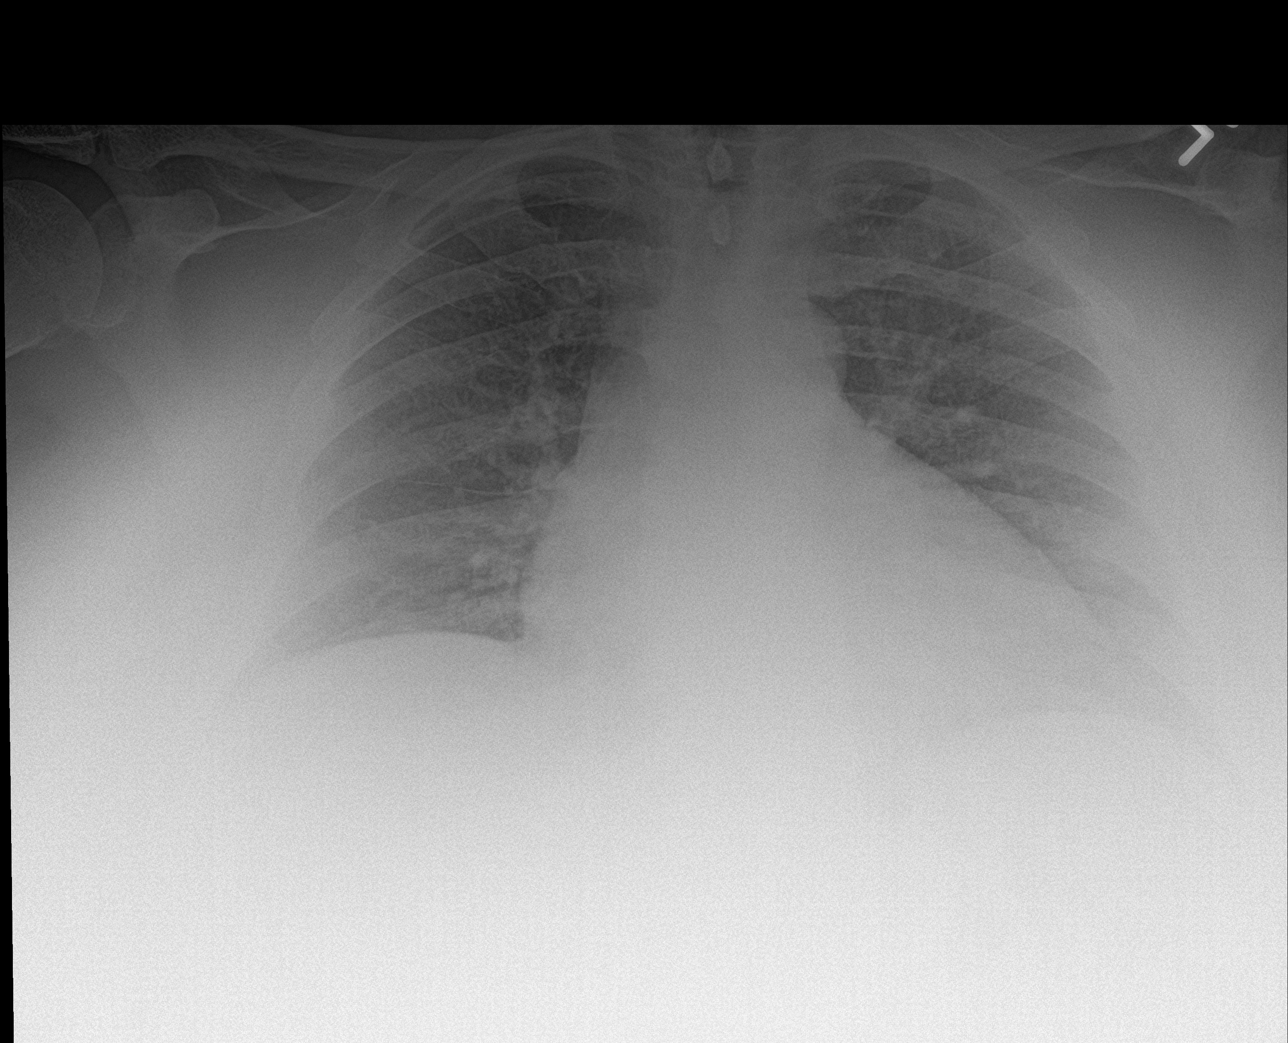

[3 of 3 positions shown; findings below may reference images not displayed]

FINDINGS: Mild cardiomegaly with mild central vascular congestion. No focal
consolidation, pleural effusion, pneumothorax. No acute osseous
pathology.
IMPRESSION: Mild cardiomegaly with mild central vascular congestion. No focal
consolidation.

## 2022-11-26 ENCOUNTER — Ambulatory Visit: Payer: BC Managed Care – PPO | Admitting: Physician Assistant

## 2022-11-26 ENCOUNTER — Ambulatory Visit: Payer: BC Managed Care – PPO | Admitting: Podiatry

## 2022-11-30 ENCOUNTER — Ambulatory Visit: Payer: BC Managed Care – PPO | Admitting: Podiatry

## 2022-12-07 ENCOUNTER — Encounter: Payer: BC Managed Care – PPO | Attending: Physician Assistant | Admitting: Physician Assistant

## 2022-12-07 DIAGNOSIS — I890 Lymphedema, not elsewhere classified: Secondary | ICD-10-CM | POA: Insufficient documentation

## 2022-12-07 DIAGNOSIS — L97822 Non-pressure chronic ulcer of other part of left lower leg with fat layer exposed: Secondary | ICD-10-CM | POA: Diagnosis not present

## 2022-12-07 DIAGNOSIS — E1151 Type 2 diabetes mellitus with diabetic peripheral angiopathy without gangrene: Secondary | ICD-10-CM | POA: Insufficient documentation

## 2022-12-07 DIAGNOSIS — I872 Venous insufficiency (chronic) (peripheral): Secondary | ICD-10-CM | POA: Insufficient documentation

## 2022-12-07 DIAGNOSIS — M109 Gout, unspecified: Secondary | ICD-10-CM | POA: Diagnosis not present

## 2022-12-07 DIAGNOSIS — E11621 Type 2 diabetes mellitus with foot ulcer: Secondary | ICD-10-CM | POA: Insufficient documentation

## 2022-12-07 DIAGNOSIS — I10 Essential (primary) hypertension: Secondary | ICD-10-CM | POA: Diagnosis not present

## 2022-12-07 DIAGNOSIS — L97522 Non-pressure chronic ulcer of other part of left foot with fat layer exposed: Secondary | ICD-10-CM | POA: Insufficient documentation

## 2022-12-08 NOTE — Progress Notes (Signed)
JKAI, ELFMAN ([id number]) [id number] Page 1 of 12 Visit Report for 12/07/2022 Arrival Information Details Patient Name: Date of Service: [name] [name], [place] 12/07/2022 10:30 A M Medical Record Number: [id number] Patient Account Number: [id number] Date of Birth/Sex: Treating RN: December 20, 1970 (52 y.o. [name] [name] Primary Care [name] [name]: [name] [name] Other Clinician: Referring [name] [name]: Treating [name] [name]/Extender: [name] [name] in Treatment: 5 Visit Information History Since Last Visit Added or deleted any medications: No Patient Arrived: Ambulatory Any new allergies or adverse reactions: No Arrival Time: 10:30 Had a fall or experienced change in No Accompanied By: self activities of daily living that may affect Transfer Assistance: None risk of falls: Patient Identification Verified: Yes Hospitalized since last visit: No Secondary Verification Process Completed: Yes Has Dressing in Place as Prescribed: Yes Patient Has Alerts: Yes Has Compression in Place as Prescribed: Yes Patient Alerts: type 2 diabetic Pain Present Now: No ABI 11/01/22 [name] [name] Electronic Signature(s) Signed: 12/08/2022 11:18:13 AM By: Angelina [name] Entered By: Angelina [name] on 12/07/2022 15:41:57 -------------------------------------------------------------------------------- Clinic Level of Care Assessment Details Patient Name: Date of Service: [name] [name] [place] 12/07/2022 10:30 A M Medical Record Number: [id number] Patient Account Number: [id number] Date of Birth/Sex: Treating RN: [birth year] (52 y.o. [name] [name] Primary Care [name] [name]: [name] [name] Other Clinician: Referring [name] [name]: Treating [name] [name]/Extender: [name] [name] in Treatment: 5 Clinic Level of Care Assessment Items TOOL 4 Quantity Score []  - 0 Use when only an EandM is performed on FOLLOW-UP visit ASSESSMENTS - Nursing Assessment /  Reassessment X- 1 10 Reassessment of Co-morbidities (includes updates in patient status) X- 1 5 Reassessment of Adherence to Treatment Plan ASSESSMENTS - Wound and Skin A ssessment / Reassessment []  - 0 Simple Wound Assessment / Reassessment - one wound [name] [name] ([id number]) [id number] Page 2 of 12 X- 2 5 Complex Wound Assessment / Reassessment - multiple wounds []  - 0 Dermatologic / Skin Assessment (not related to wound area) ASSESSMENTS - Focused Assessment []  - 0 Circumferential Edema Measurements - multi extremities []  - 0 Nutritional Assessment / Counseling / Intervention X- 1 5 Lower Extremity Assessment (monofilament, tuning fork, pulses) []  - 0 Peripheral Arterial Disease Assessment (using hand held doppler) ASSESSMENTS - Ostomy and/or Continence Assessment and Care []  - 0 Incontinence Assessment and Management []  - 0 Ostomy Care Assessment and Management (repouching, etc.) PROCESS - Coordination of Care X - Simple Patient / Family Education for ongoing care 1 15 []  - 0 Complex (extensive) Patient / Family Education for ongoing care X- 1 10 Staff obtains Chiropractor, Records, T Results / Process Orders est []  - 0 Staff telephones HHA, Nursing Homes / Clarify orders / etc []  - 0 Routine Transfer to another Facility (non-emergent condition) []  - 0 Routine Hospital Admission (non-emergent condition) []  - 0 New Admissions / Manufacturing engineer / Ordering NPWT Apligraf, etc. , []  - 0 Emergency Hospital Admission (emergent condition) X- 1 10 Simple Discharge Coordination []  - 0 Complex (extensive) Discharge Coordination PROCESS - Special Needs []  - 0 Pediatric / Minor Patient Management []  - 0 Isolation Patient Management []  - 0 Hearing / Language / Visual special needs []  - 0 Assessment of Community assistance (transportation, D/C planning, etc.) []  - 0 Additional assistance / Altered mentation []  - 0 Support  Surface(s) Assessment (bed, cushion, seat, etc.) INTERVENTIONS - Wound Cleansing / Measurement []  - 0 Simple Wound Cleansing - one wound X- 2 5 Complex Wound Cleansing - multiple wounds X- 1  5 Wound Imaging (photographs - any number of wounds) []  - 0 Wound Tracing (instead of photographs) []  - 0 Simple Wound Measurement - one wound X- 2 5 Complex Wound Measurement - multiple wounds INTERVENTIONS - Wound Dressings X - Small Wound Dressing one or multiple wounds 2 10 []  - 0 Medium Wound Dressing one or multiple wounds []  - 0 Large Wound Dressing one or multiple wounds X- 1 5 Application of Medications - topical []  - 0 Application of Medications - injection INTERVENTIONS - Miscellaneous []  - 0 External ear exam []  - 0 Specimen Collection (cultures, biopsies, blood, body fluids, etc.) []  - 0 Specimen(s) / Culture(s) sent or taken to Lab for analysis [name] [name], [name] [name] ([id number]) [id number].pdf Page 3 of 12 []  - 0 Patient Transfer (multiple staff / Nurse, adult / Similar devices) []  - 0 Simple Staple / Suture removal (25 or less) []  - 0 Complex Staple / Suture removal (26 or more) []  - 0 Hypo / Hyperglycemic Management (close monitor of Blood Glucose) []  - 0 Ankle / Brachial Index (ABI) - do not check if billed separately X- 1 5 Vital Signs Has the patient been seen at the hospital within the last three years: Yes Total Score: 120 Level Of Care: New/Established - Level 4 Electronic Signature(s) Signed: 12/08/2022 11:18:13 AM By: Angelina [name] Entered By: Angelina [name] on 12/07/2022 15:46:49 -------------------------------------------------------------------------------- Encounter Discharge Information Details Patient Name: Date of Service: [name], [place] 12/07/2022 10:30 A M Medical Record Number: [id number] Patient Account Number: [id number] Date of Birth/Sex: Treating RN: 1970-04-11 (52 y.o. [name] [name] Primary Care [name] [name]:  [name] [name] Other Clinician: Referring [name] [name]: Treating [name] [name]/Extender: [name] [name] in Treatment: 5 Encounter Discharge Information Items Discharge Condition: Stable Ambulatory Status: Ambulatory Discharge Destination: Home Transportation: Private Auto Accompanied By: self Schedule Follow-up Appointment: Yes Clinical Summary of Care: Electronic Signature(s) Signed: 12/08/2022 11:18:13 AM By: Angelina [name] Entered By: Angelina [name] on 12/07/2022 15:48:21 -------------------------------------------------------------------------------- Lower Extremity Assessment Details Patient Name: Date of Service: [name] [name], [place] 12/07/2022 10:30 A M Medical Record Number: [id number] Patient Account Number: [id number] Date of Birth/Sex: Treating RN: 06-Oct-1970 (52 y.o. [name] [name] Primary Care Alek Poncedeleon: [name] [name] Other Clinician: Mellody [name] ([id number]) [id number] Page 4 of 12 Referring Maurilio Puryear: Treating Mckaylin Bastien/Extender: [name] [name] in Treatment: 5 Edema Assessment Assessed: [Left: No] [Right: No] Edema: [Left: Ye] [Right: s] Calf Left: Right: Point of Measurement: 39 cm From Medial Instep 55 cm Ankle Left: Right: Point of Measurement: 12 cm From Medial Instep 31 cm Vascular Assessment Pulses: Dorsalis Pedis Palpable: [Left:Yes] Extremity colors, hair growth, and conditions: Extremity Color: [Left:Normal] Hair Growth on Extremity: [Left:No] Temperature of Extremity: [Left:Warm < 3 seconds] Toe Nail Assessment Left: Right: Thick: Yes Discolored: No Deformed: No Improper Length and Hygiene: Yes Electronic Signature(s) Signed: 12/08/2022 11:18:13 AM By: Angelina [name] Entered By: Angelina [name] on 12/07/2022 15:45:00 -------------------------------------------------------------------------------- Multi Wound Chart Details Patient Name: Date of Service: [name] LLO  Faythe [name] Y, [place] 12/07/2022 10:30 A M Medical Record Number: [id number] Patient Account Number: [id number] Date of Birth/Sex: Treating RN: 06/28/1970 (52 y.o. [name] [name] Primary Care Sadeel Fiddler: [name] [name] Other Clinician: Referring Shakhia Gramajo: Treating Mareo Portilla/Extender: [name] [name] in Treatment: 5 Vital Signs Height(in): 74 Pulse(bpm): 86 Weight(lbs): 546 Blood Pressure(mmHg): 195/94 Body Mass Index(BMI): 70.1 Temperature(F): 98.1 Respiratory Rate(breaths/min): 18 [1:Photos:] [3:[id number] Page 5 of 12] Left, Plantar Foot Left, Anterior Lower Leg Left, Lateral Lower Leg Wound  Location: Gradually Appeared Gradually Appeared Gradually Appeared Wounding Event: Diabetic Wound/Ulcer of the Lower Diabetic Wound/Ulcer of the Lower Diabetic Wound/Ulcer of the Lower Primary Etiology: Extremity Extremity Extremity Hypertension, Type II Diabetes, Gout Hypertension, Type II Diabetes, Gout Hypertension, Type II Diabetes, Gout Comorbid History: 08/19/2022 08/19/2022 08/19/2022 Date Acquired: 5 5 5  Weeks of Treatment: Open Healed - Epithelialized Open Wound Status: No No No Wound Recurrence: 1x1.4x0.2 0x0x0 1.4x1.3x0.1 Measurements L x W x D (cm) 1.1 0 1.429 A (cm) : rea 0.22 0 0.143 Volume (cm) : -45.90% 100.00% -1.10% % Reduction in A rea: 2.70% 100.00% 49.50% % Reduction in Volume: 10 Starting Position 1 (o'clock): 2 Ending Position 1 (o'clock): 0.3 Maximum Distance 1 (cm): Yes No No Undermining: Grade 1 Grade 1 Grade 1 Classification: Medium None Present Medium Exudate A mount: Serosanguineous N/A Serosanguineous Exudate Type: red, brown N/A red, brown Exudate Color: Large (67-100%) None Present (0%) Large (67-100%) Granulation A mount: Red N/A Red Granulation Quality: Small (1-33%) None Present (0%) Small (1-33%) Necrotic A mount: Fat Layer (Subcutaneous Tissue): Yes Fat Layer (Subcutaneous Tissue): No  Fat Layer (Subcutaneous Tissue): Yes Exposed Structures: None Large (67-100%) None Epithelialization: Treatment Notes Electronic Signature(s) Signed: 12/08/2022 11:18:13 AM By: Angelina [name] Entered By: Angelina [name] on 12/07/2022 15:45:47 -------------------------------------------------------------------------------- Multi-Disciplinary Care Plan Details Patient Name: Date of Service: [name] [name], [place] 12/07/2022 10:30 A M Medical Record Number: [id number] Patient Account Number: [id number] Date of Birth/Sex: Treating RN: 01/21/1970 (52 y.o. [name] [name] Primary Care Zillah Alexie: [name] [name] Other Clinician: Referring Marek Nghiem: Treating Mittie Knittel/Extender: [name] [name] in Treatment: 5 Active Inactive Necrotic Tissue Nursing Diagnoses: Impaired tissue integrity related to necrotic/devitalized tissue Knowledge deficit related to management of necrotic/devitalized tissue Goals: Necrotic/devitalized tissue will be minimized in the wound bed Date Initiated: 11/01/2022 Target Resolution Date: 12/27/2022 Goal Status: Active [name] [name], [name] [name] ([id number]) [id number] Page 6 of 12 Patient/caregiver will verbalize understanding of reason and process for debridement of necrotic tissue Date Initiated: 11/01/2022 Date Inactivated: 11/01/2022 Target Resolution Date: 11/01/2022 Goal Status: Met Interventions: Assess patient pain level pre-, during and post procedure and prior to discharge Provide education on necrotic tissue and debridement process Treatment Activities: Apply topical anesthetic as ordered : 11/01/2022 Excisional debridement : 11/01/2022 Notes: Wound/Skin Impairment Nursing Diagnoses: Impaired tissue integrity Knowledge deficit related to ulceration/compromised skin integrity Goals: Ulcer/skin breakdown will have a volume reduction of 30% by week 4 Date Initiated: 11/01/2022 Date Inactivated:  12/07/2022 Target Resolution Date: 11/29/2022 Goal Status: Unmet Unmet Reason: comorbidities Ulcer/skin breakdown will have a volume reduction of 50% by week 8 Date Initiated: 11/01/2022 Target Resolution Date: 12/27/2022 Goal Status: Active Ulcer/skin breakdown will have a volume reduction of 80% by week 12 Date Initiated: 11/01/2022 Target Resolution Date: 01/24/2023 Goal Status: Active Ulcer/skin breakdown will heal within 14 weeks Date Initiated: 11/01/2022 Target Resolution Date: 02/07/2023 Goal Status: Active Interventions: Assess patient/caregiver ability to obtain necessary supplies Assess patient/caregiver ability to perform ulcer/skin care regimen upon admission and as needed Assess ulceration(s) every visit Provide education on ulcer and skin care Notes: Electronic Signature(s) Signed: 12/08/2022 11:18:13 AM By: Angelina [name] Entered By: Angelina [name] on 12/07/2022 15:47:22 -------------------------------------------------------------------------------- Pain Assessment Details Patient Name: Date of Service: [name] [name], [name] 12/07/2022 10:30 A M Medical Record Number: [id number] Patient Account Number: [id number] Date of Birth/Sex: Treating RN: July 06, 1970 (52 y.o. [name] [name] Primary Care Shanik Brookshire: [name] [name] Other Clinician: Referring Daveda Larock: Treating Krishika Bugge/Extender: [name] [name] in Treatment: 5  Active Problems Location of Pain Severity and Description of Pain Patient Has Paino No Site Locations [place], [place] ([id number]) [id number] Page 7 of 12 Pain Management and Medication Current Pain Management: Electronic Signature(s) Signed: 12/08/2022 11:18:13 AM By: Angelina [name] Entered By: Angelina [name] on 12/07/2022 15:42:45 -------------------------------------------------------------------------------- Patient/Caregiver Education Details Patient Name: Date of Service: [name] [name] Y, [place] 12/3/2024andnbsp10:30 A M Medical Record Number: [id number] Patient Account Number: [id number] Date of Birth/Gender: Treating RN: [birth year] (52 y.o. [name] [name] Primary Care Physician: [name] [name] Other Clinician: Referring Physician: Treating Physician/Extender: [name] [name] in Treatment: 5 Education Assessment Education Provided To: Patient Education Topics Provided Wound/Skin Impairment: Handouts: Caring for Your Ulcer Methods: Explain/Verbal Responses: State content correctly Electronic Signature(s) Signed: 12/08/2022 11:18:13 AM By: Angelina [name] Entered By: Angelina [name] on 12/07/2022 15:47:35 [name] [name] ([id number]) [id number] Page 8 of 12 -------------------------------------------------------------------------------- Wound Assessment Details Patient Name: Date of Service: [name] [name], [place] 12/07/2022 10:30 A M Medical Record Number: [id number] Patient Account Number: [id number] Date of Birth/Sex: Treating RN: Nov 13, 1970 (52 y.o. [name] [name] Primary Care Chene Kasinger: [name] [name] Other Clinician: Referring Mihaela Fajardo: Treating Manley Fason/Extender: [name] [name] in Treatment: 5 Wound Status Wound Number: 1 Primary Etiology: Diabetic Wound/Ulcer of the Lower Extremity Wound Location: Left, Plantar Foot Wound Status: Open Wounding Event: Gradually Appeared Comorbid History: Hypertension, Type II Diabetes, Gout Date Acquired: 08/19/2022 Weeks Of Treatment: 5 Clustered Wound: No Photos Wound Measurements Length: (cm) 1 Width: (cm) 1.4 Depth: (cm) 0.2 Area: (cm) 1.1 Volume: (cm) 0.22 % Reduction in Area: -45.9% % Reduction in Volume: 2.7% Epithelialization: None Tunneling: No Undermining: Yes Starting Position (o'clock): 10 Ending Position (o'clock): 2 Maximum Distance: (cm) 0.3 Wound Description Classification: Grade 1 Exudate Amount:  Medium Exudate Type: Serosanguineous Exudate Color: red, brown Foul Odor After Cleansing: No Slough/Fibrino Yes Wound Bed Granulation Amount: Large (67-100%) Exposed Structure Granulation Quality: Red Fat Layer (Subcutaneous Tissue) Exposed: Yes Necrotic Amount: Small (1-33%) Necrotic Quality: Adherent Slough Treatment Notes Wound #1 (Foot) Wound Laterality: Plantar, Left Cleanser Byram Ancillary Kit - 15 Day Supply Discharge Instruction: Use supplies as instructed; Kit contains: (15) Saline Bullets; (15) 3x3 Gauze; 15 pr Gloves [name] [name], [name] [name] ([id number]) [id number] Page 9 of 12 Soap and Water Discharge Instruction: Gently cleanse wound with antibacterial soap, rinse and pat dry prior to dressing wounds Wound Cleanser Discharge Instruction: Wash your hands with soap and water. Remove old dressing, discard into plastic bag and place into trash. Cleanse the wound with Wound Cleanser prior to applying a clean dressing using gauze sponges, not tissues or cotton balls. Do not scrub or use excessive force. Pat dry using gauze sponges, not tissue or cotton balls. Peri-Wound Care Topical Primary Dressing Hydrofera Blue Ready Transfer Foam, 2.5x2.5 (in/in) Discharge Instruction: Apply Hydrofera Blue Ready to wound bed as directed Secondary Dressing (BORDER) Zetuvit Plus SILICONE BORDER Dressing 4x4 (in/in) Discharge Instruction: Please do not put silicone bordered dressings under wraps. Use non-bordered dressing only. Secured With Compression Wrap Compression Stockings Add-Ons Electronic Signature(s) Signed: 12/08/2022 11:18:13 AM By: Angelina [name] Entered By: Angelina [name] on 12/07/2022 15:43:24 -------------------------------------------------------------------------------- Wound Assessment Details Patient Name: Date of Service: [name] [name], [place] 12/07/2022 10:30 A M Medical Record Number: [id number] Patient Account Number: [id number] Date of  Birth/Sex: Treating RN: May 07, 1970 (52 y.o. [name] [name] Primary Care Oren Barella: [name] [name] Other Clinician: Referring Trent Theisen: Treating Dody Smartt/Extender: [name] [name] in Treatment: 5 Wound Status Wound Number: 2  Primary Etiology: Diabetic Wound/Ulcer of the Lower Extremity Wound Location: Left, Anterior Lower Leg Wound Status: Healed - Epithelialized Wounding Event: Gradually Appeared Comorbid History: Hypertension, Type II Diabetes, Gout Date Acquired: 08/19/2022 Weeks Of Treatment: 5 Clustered Wound: No Photos Wound Measurements [name] [name], [name] [name] ([id number]) Length: (cm) 0 Width: (cm) 0 Depth: (cm) 0 Area: (cm) 0 Volume: (cm) 0 [id number] Page 10 of 12 % Reduction in Area: 100% % Reduction in Volume: 100% Epithelialization: Large (67-100%) Tunneling: No Undermining: No Wound Description Classification: Grade 1 Exudate Amount: None Present Foul Odor After Cleansing: No Slough/Fibrino No Wound Bed Granulation Amount: None Present (0%) Exposed Structure Necrotic Amount: None Present (0%) Fat Layer (Subcutaneous Tissue) Exposed: No Electronic Signature(s) Signed: 12/08/2022 11:18:13 AM By: Angelina [name] Entered By: Angelina [name] on 12/07/2022 15:43:59 -------------------------------------------------------------------------------- Wound Assessment Details Patient Name: Date of Service: [name] [name], [place] 12/07/2022 10:30 A M Medical Record Number: [id number] Patient Account Number: [id number] Date of Birth/Sex: Treating RN: [birth year] (52 y.o. [name] [name] Primary Care Abdo Denault: [name] [name] Other Clinician: Referring Markitta Ausburn: Treating Arti Trang/Extender: [name] [name] in Treatment: 5 Wound Status Wound Number: 3 Primary Etiology: Diabetic Wound/Ulcer of the Lower Extremity Wound Location: Left, Lateral Lower Leg Wound Status: Open Wounding Event: Gradually  Appeared Comorbid History: Hypertension, Type II Diabetes, Gout Date Acquired: 08/19/2022 Weeks Of Treatment: 5 Clustered Wound: No Photos Wound Measurements Length: (cm) 1.4 Width: (cm) 1.3 Depth: (cm) 0.1 Area: (cm) 1.429 Volume: (cm) 0.143 % Reduction in Area: -1.1% % Reduction in Volume: 49.5% Epithelialization: None Tunneling: No Undermining: No Wound Description Classification: Grade 1 [name] [name], [name] [name] ([id number]) Exudate Amount: Medium Exudate Type: Serosanguineou Exudate Color: red, brown Foul Odor After Cleansing: No [id number] Page 11 of 12 Slough/Fibrino Yes s Wound Bed Granulation Amount: Large (67-100%) Exposed Structure Granulation Quality: Red Fat Layer (Subcutaneous Tissue) Exposed: Yes Necrotic Amount: Small (1-33%) Necrotic Quality: Adherent Slough Treatment Notes Wound #3 (Lower Leg) Wound Laterality: Left, Lateral Cleanser Byram Ancillary Kit - 15 Day Supply Discharge Instruction: Use supplies as instructed; Kit contains: (15) Saline Bullets; (15) 3x3 Gauze; 15 pr Gloves Soap and Water Discharge Instruction: Gently cleanse wound with antibacterial soap, rinse and pat dry prior to dressing wounds Wound Cleanser Discharge Instruction: Wash your hands with soap and water. Remove old dressing, discard into plastic bag and place into trash. Cleanse the wound with Wound Cleanser prior to applying a clean dressing using gauze sponges, not tissues or cotton balls. Do not scrub or use excessive force. Pat dry using gauze sponges, not tissue or cotton balls. Peri-Wound Care Topical Primary Dressing Hydrofera Blue Ready Transfer Foam, 2.5x2.5 (in/in) Discharge Instruction: Apply Hydrofera Blue Ready to wound bed as directed Secondary Dressing (BORDER) Zetuvit Plus SILICONE BORDER Dressing 4x4 (in/in) Discharge Instruction: Please do not put silicone bordered dressings under wraps. Use non-bordered dressing only. Secured  With Compression Wrap Compression Stockings Add-Ons Electronic Signature(s) Signed: 12/08/2022 11:18:13 AM By: Angelina [name] Entered By: Angelina [name] on 12/07/2022 15:44:30 -------------------------------------------------------------------------------- Vitals Details Patient Name: Date of Service: [name] LLO [place], [place] 12/07/2022 10:30 A M Medical Record Number: [id number] Patient Account Number: [id number] Date of Birth/Sex: Treating RN: 01-24-1970 (52 y.o. [name] [name] Primary Care Derrel Moore: [name] [name] Other Clinician: Referring Flavius Repsher: Treating Leia Coletti/Extender: [name] [name] in Treatment: 5 Vital Signs Time Taken: 10:35 Temperature (F): 98.1 Height (in): 74 Pulse (bpm): 86 Weight (lbs): 546 Respiratory Rate (breaths/min): 18 [name] [name], [name] [name] ([id number]) [id number] Page 12 of 12 Body Mass  Index (BMI): 70.1 Blood Pressure (mmHg): 195/94 Reference Range: 80 - 120 mg / dl Notes pt asymptomatic, states took normal BP med, PA Stone made aware Electronic Signature(s) Signed: 12/08/2022 11:18:13 AM By: Angelina [name] Entered By: Angelina [name] on 12/07/2022 15:42:39

## 2022-12-09 NOTE — Progress Notes (Signed)
RODOLPHE, RETTIG ([id number]) [id number] Page 1 of 7 Visit Report for 12/07/2022 Chief Complaint Document Details Patient Name: Date of Service: [name] [name], [place] 12/07/2022 10:30 A M Medical Record Number: [id number] Patient Account Number: [id number] Date of Birth/Sex: Treating RN: 1970/04/08 (52 y.o. Laymond Purser Primary Care Provider: Einar Crow Other Clinician: Referring Provider: Treating Provider/Extender: Johny Shears in Treatment: 5 Information Obtained from: Patient Chief Complaint Left foot and Left LE Ulcers Electronic Signature(s) Signed: 12/09/2022 7:39:18 AM By: Allen Derry PA-C Entered By: Allen Derry on 12/09/2022 04:15:51 -------------------------------------------------------------------------------- HPI Details Patient Name: Date of Service: [place] LLO [place] Y, [place] 12/07/2022 10:30 A M Medical Record Number: [id number] Patient Account Number: [id number] Date of Birth/Sex: Treating RN: [birth year] (52 y.o. Laymond Purser Primary Care Provider: Einar Crow Other Clinician: Referring Provider: Treating Provider/Extender: Johny Shears in Treatment: 5 History of Present Illness Chronic/Inactive Conditions Condition 1: 11-01-2022 on evaluation today patient's ABIs were unfortunately noncompressible bilaterally though he has good pulses 1+ equal bilaterally when I palpated and the reason I could not feel them better it appears as because of the fact that he is having a lot of issues with swelling and again due to his size in general. Nonetheless his capillary refill was actually less than a second and it filled really quickly after testing and I feel like that his extremities were nice and warm he had hair growth as well in the lower extremities I do not think he needs to go for formal evaluation at this point. HPI Description: 11-01-2022 upon evaluation today patient's  wound bed actually showed signs of having multiple wounds over the left lower extremity 2 in fact on the leg 1 on the plantar foot. He tells me he does not really know how long the foot is been present although he states his wife noted it 2 weeks ago. Obviously looking at this wound I think this is much longer than 2 weeks although he does not have feeling he is a diabetic type II with diabetic neuropathy and this subsequently means that he does not really know exactly when this occurred. He is not having any pain obviously at this point in the leg ulcers he tells me started out as blisters and again the foot he really does not know how that started. He does have a significant amount of callus buildup in the foot region. Patient does have a history as noted above diabetes mellitus type 2 with a hemoglobin A1c of 7.8 that was on October 25, 2022. He also has a history of hypertension and obesity but no other major medical problems that would particularly contribute to this. Well currently in regard to 11-09-2022 upon evaluation today patient appears to be doing his wounds as far as being stable for the most part although the left anterior leg actually showed signs of cellulitis this is not doing as well as I like to see it. Has not been using Tubigrip quite right. I think we need to try to get this infection under control before all else. He voiced understanding. With that being said I do think that at this point we are going to mark where the erythema OSWIN, STOOP ([id number]) [id number] Page 2 of 7 redness is visually and tactile we are going to monitor and make sure that this does not spread if it does he needs to go to the ER soon as possible for now I am going to send  in a prescription for Levaquin for him which I think is gena be the best way to go and he is in agreement with that plan. 11-18-2022 upon evaluation today patient appears to be doing decently well  currently in regard to his leg. With that being said I feel like the erythema is a little bit better we're not completely resolved yet but I definitely see signs of improvement compared to where we were. Fortunately I do not see any evidence of active infection systemically which is great news. 12-07-2022 upon evaluation today patient appears to be doing well currently in regard to his wound on the leg which one of them is healed the other seems to be improving significantly at this point. Fortunately I do not see any signs of active infection at this time. No fevers, chills, nausea, vomiting, or diarrhea. Electronic Signature(s) Signed: 12/09/2022 7:39:18 AM By: Allen Derry PA-C Entered By: Allen Derry on 12/09/2022 04:16:15 -------------------------------------------------------------------------------- Physical Exam Details Patient Name: Date of Service: [name] [name], [place] 12/07/2022 10:30 A M Medical Record Number: [id number] Patient Account Number: [id number] Date of Birth/Sex: Treating RN: May 09, 1970 (52 y.o. Laymond Purser Primary Care Provider: Einar Crow Other Clinician: Referring Provider: Treating Provider/Extender: Johny Shears in Treatment: 5 Constitutional Well-nourished and well-hydrated in no acute distress. Respiratory normal breathing without difficulty. Psychiatric this patient is able to make decisions and demonstrates good insight into disease process. Alert and Oriented x 3. pleasant and cooperative. Notes Patient's wound on the foot is still showing signs of significant callus buildup I discussed with the patient I really think he needs to be in a total contact cast and I think this is something we should try to get going for him as quickly as possible. He voiced understanding and he tells me that he is in agreement with proceeding as such. We will go ahead and plan to do this in the next 1 to 2 weeks depending on how quickly we  can get him scheduled. Electronic Signature(s) Signed: 12/09/2022 7:39:18 AM By: Allen Derry PA-C Entered By: Allen Derry on 12/09/2022 04:16:45 -------------------------------------------------------------------------------- Physician Orders Details Patient Name: Date of Service: [place] LLO [place] Y,  [place] 12/07/2022 10:30 A M Medical Record Number: [id number] Patient Account Number: [id number] Date of Birth/Sex: Treating RN: [birth year] (52 y.o. Laymond Purser Primary Care Provider: Einar Crow Other Clinician: Referring Provider: Treating Provider/Extender: Alvino Chapel [place], Jerilynn Som ([id number]) [id number] Page 3 of 7 Weeks in Treatment: 5 The following information was scribed by: Angelina Pih The information was scribed for: Allen Derry Verbal / Phone Orders: No Diagnosis Coding Follow-up Appointments Return Appointment in 1 week. Bathing/ Shower/ Hygiene May shower; gently cleanse wound with antibacterial soap, rinse and pat dry prior to dressing wounds No tub bath. Anesthetic (Use 'Patient Medications' Section for Anesthetic Order Entry) Lidocaine applied to wound bed Edema Control - Orders / Instructions Tubigrip single layer applied. - E Elevate, Exercise Daily and A void Standing for Long Periods of Time. Elevate leg(s) parallel to the floor when sitting. DO YOUR BEST to sleep in the bed at night. DO NOT sleep in your recliner. Long hours of sitting in a recliner leads to swelling of the legs and/or potential wounds on your backside. Off-Loading Open toe surgical shoe with peg assist. - left foot Other: - dial soap original gold recommended Medications-Please add to medication list. ntibiotics - continue and finish as prescribed P.O. A Wound Treatment Wound #1 - Foot  Wound Laterality: Plantar, Left Cleanser: Byram Ancillary Kit - 15 Day Supply (Generic) Every Other Day/30 Days Discharge Instructions: Use  supplies as instructed; Kit contains: (15) Saline Bullets; (15) 3x3 Gauze; 15 pr Gloves Cleanser: Soap and Water Every Other Day/30 Days Discharge Instructions: Gently cleanse wound with antibacterial soap, rinse and pat dry prior to dressing wounds Cleanser: Wound Cleanser Every Other Day/30 Days Discharge Instructions: Wash your hands with soap and water. Remove old dressing, discard into plastic bag and place into trash. Cleanse the wound with Wound Cleanser prior to applying a clean dressing using gauze sponges, not tissues or cotton balls. Do not scrub or use excessive force. Pat dry using gauze sponges, not tissue or cotton balls. Prim Dressing: Hydrofera Blue Ready Transfer Foam, 2.5x2.5 (in/in) Every Other Day/30 Days ary Discharge Instructions: Apply Hydrofera Blue Ready to wound bed as directed Secondary Dressing: (BORDER) Zetuvit Plus SILICONE BORDER Dressing 4x4 (in/in) (Generic) Every Other Day/30 Days Discharge Instructions: Please do not put silicone bordered dressings under wraps. Use non-bordered dressing only. Wound #3 - Lower Leg Wound Laterality: Left, Lateral Cleanser: Byram Ancillary Kit - 15 Day Supply Every Other Day/30 Days Discharge Instructions: Use supplies as instructed; Kit contains: (15) Saline Bullets; (15) 3x3 Gauze; 15 pr Gloves Cleanser: Soap and Water Every Other Day/30 Days Discharge Instructions: Gently cleanse wound with antibacterial soap, rinse and pat dry prior to dressing wounds Cleanser: Wound Cleanser Every Other Day/30 Days Discharge Instructions: Wash your hands with soap and water. Remove old dressing, discard into plastic bag and place into trash. Cleanse the wound with Wound Cleanser prior to applying a clean dressing using gauze sponges, not tissues or cotton balls. Do not scrub or use excessive force. Pat dry using gauze sponges, not tissue or cotton balls. Prim Dressing: Hydrofera Blue Ready Transfer Foam, 2.5x2.5 (in/in) Every Other Day/30  Days ary Discharge Instructions: Apply Hydrofera Blue Ready to wound bed as directed Secondary Dressing: (BORDER) Zetuvit Plus SILICONE BORDER Dressing 4x4 (in/in) (Generic) Every Other Day/30 Days Discharge Instructions: Please do not put silicone bordered dressings under wraps. Use non-bordered dressing only. Electronic Signature(s) Signed: 12/08/2022 11:18:13 AM By: Angelina Pih Signed: 12/09/2022 7:39:18 AM By: Allen Derry PA-C Entered By: Angelina Pih on 12/07/2022 12:46:16 Mellody Life ([id number]) [id number] Page 4 of 7 -------------------------------------------------------------------------------- Problem List Details Patient Name: Date of Service: [name] [name], [place] 12/07/2022 10:30 A M Medical Record Number: [id number] Patient Account Number: [id number] Date of Birth/Sex: Treating RN: 12-30-1970 (52 y.o. Laymond Purser Primary Care Provider: Einar Crow Other Clinician: Referring Provider: Treating Provider/Extender: Johny Shears in Treatment: 5 Active Problems ICD-10 Encounter Code Description Active Date MDM Diagnosis E11.621 Type 2 diabetes mellitus with foot ulcer 11/01/2022 No Yes L97.522 Non-pressure chronic ulcer of other part of left foot with fat layer exposed 11/01/2022 No Yes I87.332 Chronic venous hypertension (idiopathic) with ulcer and inflammation of left 11/01/2022 No Yes lower extremity E11.622 Type 2 diabetes mellitus with other skin ulcer 11/01/2022 No Yes L97.822 Non-pressure chronic ulcer of other part of left lower leg with fat layer 11/01/2022 No Yes exposed I10 Essential (primary) hypertension 11/01/2022 No Yes Inactive Problems Resolved Problems Electronic Signature(s) Signed: 12/09/2022 7:39:18 AM By: Allen Derry PA-C Entered By: Allen Derry on 12/09/2022 04:15:46 Mellody Life ([id number]) [id number] Page 5 of  7 -------------------------------------------------------------------------------- Progress Note Details Patient Name: Date of Service: [name] [name], [place] 12/07/2022 10:30 A M Medical Record Number: [id number] Patient Account Number: [id number] Date  of Birth/Sex: Treating RN: [birth year] (52 y.o. Laymond Purser Primary Care Provider: Einar Crow Other Clinician: Referring Provider: Treating Provider/Extender: Johny Shears in Treatment: 5 Subjective Chief Complaint Information obtained from Patient Left foot and Left LE Ulcers History of Present Illness (HPI) Chronic/Inactive Condition: 11-01-2022 on evaluation today patient's ABIs were unfortunately noncompressible bilaterally though he has good pulses 1+ equal bilaterally when I palpated and the reason I could not feel them better it appears as because of the fact that he is having a lot of issues with swelling and again due to his size in general. Nonetheless his capillary refill was actually less than a second and it filled really quickly after testing and I feel like that his extremities were nice and warm he had hair growth as well in the lower extremities I do not think he needs to go for formal evaluation at this point. 11-01-2022 upon evaluation today patient's wound bed actually showed signs of having multiple wounds over the left lower extremity 2 in fact on the leg 1 on the plantar foot. He tells me he does not really know how long the foot is been present although he states his wife noted it 2 weeks ago. Obviously looking at this wound I think this is much longer than 2 weeks although he does not have feeling he is a diabetic type II with diabetic neuropathy and this subsequently means that he does not really know exactly when this occurred. He is not having any pain obviously at this point in the leg ulcers he tells me started out as blisters and again the foot he really does not know how that  started. He does have a significant amount of callus buildup in the foot region. Patient does have a history as noted above diabetes mellitus type 2 with a hemoglobin A1c of 7.8 that was on October 25, 2022. He also has a history of hypertension and obesity but no other major medical problems that would particularly contribute to this. Well currently in regard to 11-09-2022 upon evaluation today patient appears to be doing his wounds as far as being stable for the most part although the left anterior leg actually showed signs of cellulitis this is not doing as well as I like to see it. Has not been using Tubigrip quite right. I think we need to try to get this infection under control before all else. He voiced understanding. With that being said I do think that at this point we are going to mark where the erythema redness is visually and tactile we are going to monitor and make sure that this does not spread if it does he needs to go to the ER soon as possible for now I am going to send in a prescription for Levaquin for him which I think is gena be the best way to go and he is in agreement with that plan. 11-18-2022 upon evaluation today patient appears to be doing decently well currently in regard to his leg. With that being said I feel like the erythema is a little bit better we're not completely resolved yet but I definitely see signs of improvement compared to where we were. Fortunately I do not see any evidence of active infection systemically which is great news. 12-07-2022 upon evaluation today patient appears to be doing well currently in regard to his wound on the leg which one of them is healed the other seems to be improving significantly at this point. Fortunately  I do not see any signs of active infection at this time. No fevers, chills, nausea, vomiting, or diarrhea. Objective Constitutional Well-nourished and well-hydrated in no acute distress. Vitals Time Taken: 10:35 AM, Height: 74  in, Weight: 546 lbs, BMI: 70.1, Temperature: 98.1 F, Pulse: 86 bpm, Respiratory Rate: 18 breaths/min, Blood Pressure: 195/94 mmHg. General Notes: pt asymptomatic, states took normal BP med, PA Stone made aware Respiratory normal breathing without difficulty. Psychiatric this patient is able to make decisions and demonstrates good insight into disease process. Alert and Oriented x 3. pleasant and cooperative. General Notes: Patient's wound on the foot is still showing signs of significant callus buildup I discussed with the patient I really think he needs to be in a total contact cast and I think this is something we should try to get going for him as quickly as possible. He voiced understanding and he tells me that he is in agreement with proceeding as such. We will go ahead and plan to do this in the next 1 to 2 weeks depending on how quickly we can get him scheduled. Integumentary (Hair, Skin) Wound #1 status is Open. Original cause of wound was Gradually Appeared. The date acquired was: 08/19/2022. The wound has been in treatment 5 weeks. The wound is located on the Left,Plantar Foot. The wound measures 1cm length x 1.4cm width x 0.2cm depth; 1.1cm^2 area and 0.22cm^3 volume. There is Fat Layer (Subcutaneous Tissue) exposed. There is no tunneling noted, however, there is undermining starting at 10:00 and ending at 2:00 with a maximum distance of 0.3cm. There is a medium amount of serosanguineous drainage noted. There is large (67-100%) red granulation within the wound bed. There is a small (1-33%) amount of necrotic tissue within the wound bed including Adherent Slough. Wound #2 status is Healed - Epithelialized. Original cause of wound was Gradually Appeared. The date acquired was: 08/19/2022. The wound has been in treatment 5 weeks. The wound is located on the Left,Anterior Lower Leg. The wound measures 0cm length x 0cm width x 0cm depth; 0cm^2 area and 0cm^3 volume. There is no tunneling or  undermining noted. There is a none present amount of drainage noted. There is no granulation within the wound bed. There is no necrotic tissue within the wound bed. Wound #3 status is Open. Original cause of wound was Gradually Appeared. The date acquired was: 08/19/2022. The wound has been in treatment 5 weeks. The KOLT, RATHSACK ([id number]) [id number] Page 6 of 7 wound is located on the Left,Lateral Lower Leg. The wound measures 1.4cm length x 1.3cm width x 0.1cm depth; 1.429cm^2 area and 0.143cm^3 volume. There is Fat Layer (Subcutaneous Tissue) exposed. There is no tunneling or undermining noted. There is a medium amount of serosanguineous drainage noted. There is large (67-100%) red granulation within the wound bed. There is a small (1-33%) amount of necrotic tissue within the wound bed including Adherent Slough. Assessment Active Problems ICD-10 Type 2 diabetes mellitus with foot ulcer Non-pressure chronic ulcer of other part of left foot with fat layer exposed Chronic venous hypertension (idiopathic) with ulcer and inflammation of left lower extremity Type 2 diabetes mellitus with other skin ulcer Non-pressure chronic ulcer of other part of left lower leg with fat layer exposed Essential (primary) hypertension Plan Follow-up Appointments: Return Appointment in 1 week. Bathing/ Shower/ Hygiene: May shower; gently cleanse wound with antibacterial soap, rinse and pat dry prior to dressing wounds No tub bath. Anesthetic (Use 'Patient Medications' Section for Anesthetic Order Entry): Lidocaine applied to  wound bed Edema Control - Orders / Instructions: Tubigrip single layer applied. - E Elevate, Exercise Daily and Avoid Standing for Long Periods of Time. Elevate leg(s) parallel to the floor when sitting. DO YOUR BEST to sleep in the bed at night. DO NOT sleep in your recliner. Long hours of sitting in a recliner leads to swelling of the legs and/or  potential wounds on your backside. Off-Loading: Open toe surgical shoe with peg assist. - left foot Other: - dial soap original gold recommended Medications-Please add to medication list.: P.O. Antibiotics - continue and finish as prescribed WOUND #1: - Foot Wound Laterality: Plantar, Left Cleanser: Byram Ancillary Kit - 15 Day Supply (Generic) Every Other Day/30 Days Discharge Instructions: Use supplies as instructed; Kit contains: (15) Saline Bullets; (15) 3x3 Gauze; 15 pr Gloves Cleanser: Soap and Water Every Other Day/30 Days Discharge Instructions: Gently cleanse wound with antibacterial soap, rinse and pat dry prior to dressing wounds Cleanser: Wound Cleanser Every Other Day/30 Days Discharge Instructions: Wash your hands with soap and water. Remove old dressing, discard into plastic bag and place into trash. Cleanse the wound with Wound Cleanser prior to applying a clean dressing using gauze sponges, not tissues or cotton balls. Do not scrub or use excessive force. Pat dry using gauze sponges, not tissue or cotton balls. Prim Dressing: Hydrofera Blue Ready Transfer Foam, 2.5x2.5 (in/in) Every Other Day/30 Days ary Discharge Instructions: Apply Hydrofera Blue Ready to wound bed as directed Secondary Dressing: (BORDER) Zetuvit Plus SILICONE BORDER Dressing 4x4 (in/in) (Generic) Every Other Day/30 Days Discharge Instructions: Please do not put silicone bordered dressings under wraps. Use non-bordered dressing only. WOUND #3: - Lower Leg Wound Laterality: Left, Lateral Cleanser: Byram Ancillary Kit - 15 Day Supply Every Other Day/30 Days Discharge Instructions: Use supplies as instructed; Kit contains: (15) Saline Bullets; (15) 3x3 Gauze; 15 pr Gloves Cleanser: Soap and Water Every Other Day/30 Days Discharge Instructions: Gently cleanse wound with antibacterial soap, rinse and pat dry prior to dressing wounds Cleanser: Wound Cleanser Every Other Day/30 Days Discharge Instructions: Wash  your hands with soap and water. Remove old dressing, discard into plastic bag and place into trash. Cleanse the wound with Wound Cleanser prior to applying a clean dressing using gauze sponges, not tissues or cotton balls. Do not scrub or use excessive force. Pat dry using gauze sponges, not tissue or cotton balls. Prim Dressing: Hydrofera Blue Ready Transfer Foam, 2.5x2.5 (in/in) Every Other Day/30 Days ary Discharge Instructions: Apply Hydrofera Blue Ready to wound bed as directed Secondary Dressing: (BORDER) Zetuvit Plus SILICONE BORDER Dressing 4x4 (in/in) (Generic) Every Other Day/30 Days Discharge Instructions: Please do not put silicone bordered dressings under wraps. Use non-bordered dressing only. 1. I would recommend that we have the patient continue to monitor for any signs of infection or worsening for now he is gena continue with the wound care measures as before this includes the use of the [facility]. 2. I am going to recommend the patient should continue to monitor for any signs of infection obviously right now nothing appears to be infected I think the cast is perfect to start with at this point. We will see patient back for reevaluation in 1 week here in the clinic. If anything worsens or changes patient will contact our office for additional recommendations. Electronic Signature(s) Signed: 12/09/2022 7:39:18 AM By: Allen Derry PA-C Entered By: Allen Derry on 12/09/2022 04:17:18 Mellody Life ([id number]) [id number] Page 7 of 7 -------------------------------------------------------------------------------- SuperBill Details Patient Name: Date of Service:  [place] LLO [place] Y, [place] 12/07/2022 Medical Record Number: [id number] Patient Account Number: [id number] Date of Birth/Sex: Treating RN: June 04, 1970 (52 y.o. Laymond Purser Primary Care Provider: Einar Crow Other Clinician: Referring Provider: Treating Provider/Extender: Johny Shears in Treatment: 5 Diagnosis Coding ICD-10 Codes Code Description [phone] Type 2 diabetes mellitus with foot ulcer L97.522 Non-pressure chronic ulcer of other part of left foot with fat layer exposed I87.332 Chronic venous hypertension (idiopathic) with ulcer and inflammation of left lower extremity E11.622 Type 2 diabetes mellitus with other skin ulcer L97.822 Non-pressure chronic ulcer of other part of left lower leg with fat layer exposed I10 Essential (primary) hypertension Facility Procedures : CPT4 Code: [id number] Description: 9214 - WOUND CARE VISIT-LEV 4 EST PT Modifier: Quantity: 1 Physician Procedures : CPT4 Code Description Modifier [id number] - WC PHYS LEVEL 3 - EST PT ICD-10 Diagnosis Description E11.621 Type 2 diabetes mellitus with foot ulcer L97.522 Non-pressure chronic ulcer of other part of left foot with fat layer exposed I87.332 Chronic  venous hypertension (idiopathic) with ulcer and inflammation of left lower extremity E11.622 Type 2 diabetes mellitus with other skin ulcer Quantity: 1 Electronic Signature(s) Signed: 12/09/2022 7:39:18 AM By: Allen Derry PA-C Previous Signature: 12/08/2022 11:18:13 AM Version By: Angelina Pih Entered By: Allen Derry on 12/09/2022 04:17:57

## 2022-12-20 ENCOUNTER — Encounter: Payer: BC Managed Care – PPO | Admitting: Physician Assistant

## 2022-12-20 DIAGNOSIS — E11621 Type 2 diabetes mellitus with foot ulcer: Secondary | ICD-10-CM | POA: Diagnosis not present

## 2022-12-20 NOTE — Progress Notes (Signed)
TELESFOR, TURSO ([id number]) [id number] Page 1 of 10 Visit Report for 12/20/2022 Chief Complaint Document Details Patient Name: Date of Service: [name] [name], [place] 12/20/2022 12:15 PM Medical Record Number: [id number] Patient Account Number: [id number] Date of Birth/Sex: Treating RN: 07-08-1970 (52 y.o. [name] Primary Care Provider: Einar Crow Other Clinician: Referring Provider: Treating Provider/Extender: Johny Shears in Treatment: 7 Information Obtained from: Patient Chief Complaint Left foot and Left LE Ulcers Electronic Signature(s) Signed: 12/20/2022 12:51:15 PM By: Allen Derry PA-C Entered By: Allen Derry on 12/20/2022 12:51:14 -------------------------------------------------------------------------------- Debridement Details Patient Name: Date of Service: [name] [name], [place] 12/20/2022 12:15 PM Medical Record Number: [id number] Patient Account Number: [id number] Date of Birth/Sex: Treating RN: February 05, 1970 (52 y.o. [name] Primary Care Provider: Einar Crow Other Clinician: Referring Provider: Treating Provider/Extender: Johny Shears in Treatment: 7 Debridement Performed for Assessment: Wound #3 Left,Lateral Lower Leg Performed By: Physician Allen Derry, PA-C The following information was scribed by: Midge Aver The information was scribed for: Allen Derry Debridement Type: Debridement Severity of Tissue Pre Debridement: Fat layer exposed Level of Consciousness (Pre-procedure): Awake and Alert Pre-procedure Verification/Time Out Yes - 13:02 Taken: Start Time: 13:02 Percent of Wound Bed Debrided: 100% T Area Debrided (cm): otal 0.94 Tissue and other material debrided: Viable, Non-Viable, Slough, Subcutaneous, Slough Level: Skin/Subcutaneous Tissue Debridement Description: Excisional Instrument: Curette Bleeding: Minimum Hemostasis Achieved:  Pressure Carneal, Jovaun ([id number]) [id number] Page 2 of 10 Procedural Pain: 0 Post Procedural Pain: 0 Response to Treatment: Procedure was tolerated well Level of Consciousness (Post- Awake and Alert procedure): Post Debridement Measurements of Total Wound Length: (cm) 1 Width: (cm) 1.2 Depth: (cm) 0.1 Volume: (cm) 0.094 Character of Wound/Ulcer Post Debridement: Stable Severity of Tissue Post Debridement: Fat layer exposed Post Procedure Diagnosis Same as Pre-procedure Electronic Signature(s) Unsigned Entered By: Midge Aver on 12/20/2022 13:03:12 -------------------------------------------------------------------------------- Debridement Details Patient Name: Date of Service: [name] [name], [place] 12/20/2022 12:15 PM Medical Record Number: [id number] Patient Account Number: [id number] Date of Birth/Sex: Treating RN: 1970-11-23 (52 y.o. [name] Primary Care Provider: Einar Crow Other Clinician: Referring Provider: Treating Provider/Extender: Johny Shears in Treatment: 7 Debridement Performed for Assessment: Wound #1 Left,Plantar Foot Performed By: Physician Allen Derry, PA-C Debridement Type: Debridement Severity of Tissue Pre Debridement: Fat layer exposed Level of Consciousness (Pre-procedure): Awake and Alert Pre-procedure Verification/Time Out Yes - 13:02 Taken: Start Time: 13:02 Percent of Wound Bed Debrided: 100% T Area Debrided (cm): otal 1.1 Tissue and other material debrided: Viable, Non-Viable, Callus, Slough, Subcutaneous, Slough Level: Skin/Subcutaneous Tissue Debridement Description: Excisional Instrument: Curette Bleeding: Minimum Hemostasis Achieved: Silver Nitrate Procedural Pain: 0 Post Procedural Pain: 0 Response to Treatment: Procedure was tolerated well Level of Consciousness (Post- Awake and Alert procedure): Post Debridement Measurements of Total Wound Length: (cm)  1 Width: (cm) 1.4 Depth: (cm) 0.2 Volume: (cm) 0.22 Character of Wound/Ulcer Post Debridement: Stable Severity of Tissue Post Debridement: Limited to breakdown of skin [name] [name], [name] [name] ([id number]) [id number] Page 3 of 10 Post Procedure Diagnosis Same as Pre-procedure Electronic Signature(s) Unsigned Entered By: Midge Aver on 12/20/2022 13:10:14 -------------------------------------------------------------------------------- HPI Details Patient Name: Date of Service: [name] [name], [place] 12/20/2022 12:15 PM Medical Record Number: [id number] Patient Account Number: [id number] Date of Birth/Sex: Treating RN: March 24, 1970 (52 y.o. [name] Primary Care Provider: Einar Crow Other Clinician: Referring Provider: Treating Provider/Extender: Johny Shears in Treatment: 7 History of  Present Illness Chronic/Inactive Conditions Condition 1: 11-01-2022 on evaluation today patient's ABIs were unfortunately noncompressible bilaterally though he has good pulses 1+ equal bilaterally when I palpated and the reason I could not feel them better it appears as because of the fact that he is having a lot of issues with swelling and again due to his size in general. Nonetheless his capillary refill was actually less than a second and it filled really quickly after testing and I feel like that his extremities were nice and warm he had hair growth as well in the lower extremities I do not think he needs to go for formal evaluation at this point. HPI Description: 11-01-2022 upon evaluation today patient's wound bed actually showed signs of having multiple wounds over the left lower extremity 2 in fact on the leg 1 on the plantar foot. He tells me he does not really know how long the foot is been present although he states his wife noted it 2 weeks ago. Obviously looking at this wound I think this is much longer than 2 weeks although he does not  have feeling he is a diabetic type II with diabetic neuropathy and this subsequently means that he does not really know exactly when this occurred. He is not having any pain obviously at this point in the leg ulcers he tells me started out as blisters and again the foot he really does not know how that started. He does have a significant amount of callus buildup in the foot region. Patient does have a history as noted above diabetes mellitus type 2 with a hemoglobin A1c of 7.8 that was on October 25, 2022. He also has a history of hypertension and obesity but no other major medical problems that would particularly contribute to this. Well currently in regard to 11-09-2022 upon evaluation today patient appears to be doing his wounds as far as being stable for the most part although the left anterior leg actually showed signs of cellulitis this is not doing as well as I like to see it. Has not been using Tubigrip quite right. I think we need to try to get this infection under control before all else. He voiced understanding. With that being said I do think that at this point we are going to mark where the erythema redness is visually and tactile we are going to monitor and make sure that this does not spread if it does he needs to go to the ER soon as possible for now I am going to send in a prescription for Levaquin for him which I think is gena be the best way to go and he is in agreement with that plan. 11-18-2022 upon evaluation today patient appears to be doing decently well currently in regard to his leg. With that being said I feel like the erythema is a little bit better we're not completely resolved yet but I definitely see signs of improvement compared to where we were. Fortunately I do not see any evidence of active infection systemically which is great news. 12-07-2022 upon evaluation today patient appears to be doing well currently in regard to his wound on the leg which one of them is healed  the other seems to be improving significantly at this point. Fortunately I do not see any signs of active infection at this time. No fevers, chills, nausea, vomiting, or diarrhea. 12-20-2022 upon evaluation today patient appears to be doing well currently in regard to his wound. He has been tolerating the  dressing changes without complication. With that being said I do see some signs of improvement especially in regard to the leg it looks excellent. In regard to the foot this is. A little bit better although it still not tremendously smaller. We have been discussing doing a cast but he is really not interested in doing that tells me he does not want to go into a cast. Electronic Signature(s) Signed: 12/20/2022 2:48:38 PM By: Allen Derry PA-C Entered By: Allen Derry on 12/20/2022 14:48:38 [name] [name] ([id number]) [id number] Page 4 of 10 -------------------------------------------------------------------------------- Physical Exam Details Patient Name: Date of Service: [name] [name], [place] 12/20/2022 12:15 PM Medical Record Number: [id number] Patient Account Number: [id number] Date of Birth/Sex: Treating RN: July 04, 1970 (52 y.o. [name] Primary Care Provider: Einar Crow Other Clinician: Referring Provider: Treating Provider/Extender: Johny Shears in Treatment: 7 Constitutional Well-nourished and well-hydrated in no acute distress. Respiratory normal breathing without difficulty. Psychiatric this patient is able to make decisions and demonstrates good insight into disease process. Alert and Oriented x 3. pleasant and cooperative. Notes Upon inspection patient's wound bed actually showed signs of some good granulation and epithelization are still some need for sharp debridement today and I did perform debridement clearway necrotic debris he tolerated that today without complication and postdebridement wound bed appears  to be doing significantly better which is great news. Electronic Signature(s) Signed: 12/20/2022 2:48:55 PM By: Allen Derry PA-C Entered By: Allen Derry on 12/20/2022 14:48:54 -------------------------------------------------------------------------------- Physician Orders Details Patient Name: Date of Service: [name] [name], [place] 12/20/2022 12:15 PM Medical Record Number: [id number] Patient Account Number: [id number] Date of Birth/Sex: Treating RN: 09-26-1970 (52 y.o. [name] Primary Care Provider: Einar Crow Other Clinician: Referring Provider: Treating Provider/Extender: Johny Shears in Treatment: 7 The following information was scribed by: Midge Aver The information was scribed for: Allen Derry Verbal / Phone Orders: No Diagnosis Coding ICD-10 Coding Code Description E11.621 Type 2 diabetes mellitus with foot ulcer L97.522 Non-pressure chronic ulcer of other part of left foot with fat layer exposed I87.332 Chronic venous hypertension (idiopathic) with ulcer and inflammation of left lower extremity E11.622 Type 2 diabetes mellitus with other skin ulcer L97.822 Non-pressure chronic ulcer of other part of left lower leg with fat layer exposed Grater, [name] [name] ([id number]) [id number] Page 5 of 10 I10 Essential (primary) hypertension Follow-up Appointments Return Appointment in 1 week. Bathing/ Shower/ Hygiene May shower; gently cleanse wound with antibacterial soap, rinse and pat dry prior to dressing wounds No tub bath. Anesthetic (Use 'Patient Medications' Section for Anesthetic Order Entry) Lidocaine applied to wound bed Edema Control - Orders / Instructions Tubigrip single layer applied. - E Elevate, Exercise Daily and A void Standing for Long Periods of Time. Elevate leg(s) parallel to the floor when sitting. DO YOUR BEST to sleep in the bed at night. DO NOT sleep in your recliner. Long hours of  sitting in a recliner leads to swelling of the legs and/or potential wounds on your backside. Off-Loading Open toe surgical shoe with peg assist. - left foot Removable cast walker boot - Bought from [employer] Other: - dial soap original gold recommended Medications-Please add to medication list. ntibiotics - continue and finish as prescribed P.O. A Wound Treatment Wound #1 - Foot Wound Laterality: Plantar, Left Cleanser: Byram Ancillary Kit - 15 Day Supply (Generic) Every Other Day/30 Days Discharge Instructions: Use supplies as instructed; Kit contains: (15) Saline Bullets; (15) 3x3 Gauze; 15 pr Gloves  Cleanser: Soap and Water Every Other Day/30 Days Discharge Instructions: Gently cleanse wound with antibacterial soap, rinse and pat dry prior to dressing wounds Cleanser: Wound Cleanser Every Other Day/30 Days Discharge Instructions: Wash your hands with soap and water. Remove old dressing, discard into plastic bag and place into trash. Cleanse the wound with Wound Cleanser prior to applying a clean dressing using gauze sponges, not tissues or cotton balls. Do not scrub or use excessive force. Pat dry using gauze sponges, not tissue or cotton balls. Prim Dressing: Hydrofera Blue Ready Transfer Foam, 2.5x2.5 (in/in) Every Other Day/30 Days ary Discharge Instructions: Apply Hydrofera Blue Ready to wound bed as directed Secondary Dressing: (BORDER) Zetuvit Plus SILICONE BORDER Dressing 4x4 (in/in) (Generic) Every Other Day/30 Days Discharge Instructions: Please do not put silicone bordered dressings under wraps. Use non-bordered dressing only. Wound #3 - Lower Leg Wound Laterality: Left, Lateral Cleanser: Byram Ancillary Kit - 15 Day Supply Every Other Day/30 Days Discharge Instructions: Use supplies as instructed; Kit contains: (15) Saline Bullets; (15) 3x3 Gauze; 15 pr Gloves Cleanser: Soap and Water Every Other Day/30 Days Discharge Instructions: Gently cleanse wound with antibacterial soap,  rinse and pat dry prior to dressing wounds Cleanser: Wound Cleanser Every Other Day/30 Days Discharge Instructions: Wash your hands with soap and water. Remove old dressing, discard into plastic bag and place into trash. Cleanse the wound with Wound Cleanser prior to applying a clean dressing using gauze sponges, not tissues or cotton balls. Do not scrub or use excessive force. Pat dry using gauze sponges, not tissue or cotton balls. Prim Dressing: Hydrofera Blue Ready Transfer Foam, 2.5x2.5 (in/in) Every Other Day/30 Days ary Discharge Instructions: Apply Hydrofera Blue Ready to wound bed as directed Secondary Dressing: (BORDER) Zetuvit Plus SILICONE BORDER Dressing 4x4 (in/in) (Generic) Every Other Day/30 Days Discharge Instructions: Please do not put silicone bordered dressings under wraps. Use non-bordered dressing only. Electronic Signature(s) Unsigned Entered By: Midge Aver on 12/20/2022 13:06:08 Signature(s): [name] [name] ([id number]) [phone] Date(s): [id number] Page 6 of 10 -------------------------------------------------------------------------------- Problem List Details Patient Name: Date of Service: [name] [name], [place] 12/20/2022 12:15 PM Medical Record Number: [id number] Patient Account Number: [id number] Date of Birth/Sex: Treating RN: Jun 19, 1970 (52 y.o. [name] Primary Care Provider: Einar Crow Other Clinician: Referring Provider: Treating Provider/Extender: Johny Shears in Treatment: 7 Active Problems ICD-10 Encounter Code Description Active Date MDM Diagnosis E11.621 Type 2 diabetes mellitus with foot ulcer 11/01/2022 No Yes L97.522 Non-pressure chronic ulcer of other part of left foot with fat layer exposed 11/01/2022 No Yes I87.332 Chronic venous hypertension (idiopathic) with ulcer and inflammation of left 11/01/2022 No Yes lower extremity E11.622 Type 2 diabetes mellitus with other skin ulcer  11/01/2022 No Yes L97.822 Non-pressure chronic ulcer of other part of left lower leg with fat layer 11/01/2022 No Yes exposed I10 Essential (primary) hypertension 11/01/2022 No Yes Inactive Problems Resolved Problems Electronic Signature(s) Signed: 12/20/2022 12:51:12 PM By: Allen Derry PA-C Entered By: Allen Derry on 12/20/2022 12:51:11 [name] [name] ([id number]) [id number] Page 7 of 10 -------------------------------------------------------------------------------- Progress Note Details Patient Name: Date of Service: [name] [name], [place] 12/20/2022 12:15 PM Medical Record Number: [id number] Patient Account Number: [id number] Date of Birth/Sex: Treating RN: 16-Jan-1970 (52 y.o. [name] Primary Care Provider: Einar Crow Other Clinician: Referring Provider: Treating Provider/Extender: Johny Shears in Treatment: 7 Subjective Chief Complaint Information obtained from Patient Left foot and Left LE Ulcers History of Present Illness (HPI) Chronic/Inactive  Condition: 11-01-2022 on evaluation today patient's ABIs were unfortunately noncompressible bilaterally though he has good pulses 1+ equal bilaterally when I palpated and the reason I could not feel them better it appears as because of the fact that he is having a lot of issues with swelling and again due to his size in general. Nonetheless his capillary refill was actually less than a second and it filled really quickly after testing and I feel like that his extremities were nice and warm he had hair growth as well in the lower extremities I do not think he needs to go for formal evaluation at this point. 11-01-2022 upon evaluation today patient's wound bed actually showed signs of having multiple wounds over the left lower extremity 2 in fact on the leg 1 on the plantar foot. He tells me he does not really know how long the foot is been present although he states his  wife noted it 2 weeks ago. Obviously looking at this wound I think this is much longer than 2 weeks although he does not have feeling he is a diabetic type II with diabetic neuropathy and this subsequently means that he does not really know exactly when this occurred. He is not having any pain obviously at this point in the leg ulcers he tells me started out as blisters and again the foot he really does not know how that started. He does have a significant amount of callus buildup in the foot region. Patient does have a history as noted above diabetes mellitus type 2 with a hemoglobin A1c of 7.8 that was on October 25, 2022. He also has a history of hypertension and obesity but no other major medical problems that would particularly contribute to this. Well currently in regard to 11-09-2022 upon evaluation today patient appears to be doing his wounds as far as being stable for the most part although the left anterior leg actually showed signs of cellulitis this is not doing as well as I like to see it. Has not been using Tubigrip quite right. I think we need to try to get this infection under control before all else. He voiced understanding. With that being said I do think that at this point we are going to mark where the erythema redness is visually and tactile we are going to monitor and make sure that this does not spread if it does he needs to go to the ER soon as possible for now I am going to send in a prescription for Levaquin for him which I think is gena be the best way to go and he is in agreement with that plan. 11-18-2022 upon evaluation today patient appears to be doing decently well currently in regard to his leg. With that being said I feel like the erythema is a little bit better we're not completely resolved yet but I definitely see signs of improvement compared to where we were. Fortunately I do not see any evidence of active infection systemically which is great news. 12-07-2022 upon  evaluation today patient appears to be doing well currently in regard to his wound on the leg which one of them is healed the other seems to be improving significantly at this point. Fortunately I do not see any signs of active infection at this time. No fevers, chills, nausea, vomiting, or diarrhea. 12-20-2022 upon evaluation today patient appears to be doing well currently in regard to his wound. He has been tolerating the dressing changes without complication. With that being  said I do see some signs of improvement especially in regard to the leg it looks excellent. In regard to the foot this is. A little bit better although it still not tremendously smaller. We have been discussing doing a cast but he is really not interested in doing that tells me he does not want to go into a cast. Objective Constitutional Well-nourished and well-hydrated in no acute distress. Vitals Time Taken: 12:35 PM, Height: 74 in, Weight: 546 lbs, BMI: 70.1, Temperature: 97.5 F, Pulse: 90 bpm, Respiratory Rate: 18 breaths/min, Blood Pressure: 190/98 mmHg. Respiratory normal breathing without difficulty. Psychiatric this patient is able to make decisions and demonstrates good insight into disease process. Alert and Oriented x 3. pleasant and cooperative. General Notes: Upon inspection patient's wound bed actually showed signs of some good granulation and epithelization are still some need for sharp debridement today and I did perform debridement clearway necrotic debris he tolerated that today without complication and postdebridement wound bed appears to be doing significantly better which is great news. Integumentary (Hair, Skin) Wound #1 status is Open. Original cause of wound was Gradually Appeared. The date acquired was: 08/19/2022. The wound has been in treatment 7 weeks. The wound is located on the Left,Plantar Foot. The wound measures 1cm length x 1.4cm width x 0.2cm depth; 1.1cm^2 area and 0.22cm^3 volume. There  is Fat Layer (Subcutaneous Tissue) exposed. There is undermining starting at 11:00 and ending at 1:00 with a maximum distance of 0.8cm. There is a medium amount of serosanguineous drainage noted. There is large (67-100%) red granulation within the wound bed. There is a small (1-33%) amount of necrotic tissue within the wound bed including Adherent Slough. Wound #3 status is Open. Original cause of wound was Gradually Appeared. The date acquired was: 08/19/2022. The wound has been in treatment 7 weeks. The wound is located on the Left,Lateral Lower Leg. The wound measures 1cm length x 1.2cm width x 0.1cm depth; 0.942cm^2 area and 0.094cm^3 volume. There [name] [name], [name] [name] ([id number]) [id number] Page 8 of 10 is Fat Layer (Subcutaneous Tissue) exposed. There is a medium amount of serosanguineous drainage noted. There is large (67-100%) red granulation within the wound bed. There is a small (1-33%) amount of necrotic tissue within the wound bed including Adherent Slough. Assessment Active Problems ICD-10 Type 2 diabetes mellitus with foot ulcer Non-pressure chronic ulcer of other part of left foot with fat layer exposed Chronic venous hypertension (idiopathic) with ulcer and inflammation of left lower extremity Type 2 diabetes mellitus with other skin ulcer Non-pressure chronic ulcer of other part of left lower leg with fat layer exposed Essential (primary) hypertension Procedures Wound #1 Pre-procedure diagnosis of Wound #1 is a Diabetic Wound/Ulcer of the Lower Extremity located on the Left,Plantar Foot .Severity of Tissue Pre Debridement is: Fat layer exposed. There was a Excisional Skin/Subcutaneous Tissue Debridement with a total area of 1.1 sq cm performed by Allen Derry, PA-C. With the following instrument(s): Curette to remove Viable and Non-Viable tissue/material. Material removed includes Callus, Subcutaneous Tissue, and Slough. No specimens were taken. A time  out was conducted at 13:02, prior to the start of the procedure. A Minimum amount of bleeding was controlled with Silver Nitrate. The procedure was tolerated well with a pain level of 0 throughout and a pain level of 0 following the procedure. Post Debridement Measurements: 1cm length x 1.4cm width x 0.2cm depth; 0.22cm^3 volume. Character of Wound/Ulcer Post Debridement is stable. Severity of Tissue Post Debridement is: Limited to breakdown of skin. Post  procedure Diagnosis Wound #1: Same as Pre-Procedure Wound #3 Pre-procedure diagnosis of Wound #3 is a Diabetic Wound/Ulcer of the Lower Extremity located on the Left,Lateral Lower Leg .Severity of Tissue Pre Debridement is: Fat layer exposed. There was a Excisional Skin/Subcutaneous Tissue Debridement with a total area of 0.94 sq cm performed by Allen Derry, PA-C. With the following instrument(s): Curette to remove Viable and Non-Viable tissue/material. Material removed includes Subcutaneous Tissue and Slough and. No specimens were taken. A time out was conducted at 13:02, prior to the start of the procedure. A Minimum amount of bleeding was controlled with Pressure. The procedure was tolerated well with a pain level of 0 throughout and a pain level of 0 following the procedure. Post Debridement Measurements: 1cm length x 1.2cm width x 0.1cm depth; 0.094cm^3 volume. Character of Wound/Ulcer Post Debridement is stable. Severity of Tissue Post Debridement is: Fat layer exposed. Post procedure Diagnosis Wound #3: Same as Pre-Procedure Plan Follow-up Appointments: Return Appointment in 1 week. Bathing/ Shower/ Hygiene: May shower; gently cleanse wound with antibacterial soap, rinse and pat dry prior to dressing wounds No tub bath. Anesthetic (Use 'Patient Medications' Section for Anesthetic Order Entry): Lidocaine applied to wound bed Edema Control - Orders / Instructions: Tubigrip single layer applied. - E Elevate, Exercise Daily and Avoid  Standing for Long Periods of Time. Elevate leg(s) parallel to the floor when sitting. DO YOUR BEST to sleep in the bed at night. DO NOT sleep in your recliner. Long hours of sitting in a recliner leads to swelling of the legs and/or potential wounds on your backside. Off-Loading: Open toe surgical shoe with peg assist. - left foot Removable cast walker boot - Bought from [employer] Other: - dial soap original gold recommended Medications-Please add to medication list.: P.O. Antibiotics - continue and finish as prescribed WOUND #1: - Foot Wound Laterality: Plantar, Left Cleanser: Byram Ancillary Kit - 15 Day Supply (Generic) Every Other Day/30 Days Discharge Instructions: Use supplies as instructed; Kit contains: (15) Saline Bullets; (15) 3x3 Gauze; 15 pr Gloves Cleanser: Soap and Water Every Other Day/30 Days Discharge Instructions: Gently cleanse wound with antibacterial soap, rinse and pat dry prior to dressing wounds Cleanser: Wound Cleanser Every Other Day/30 Days Discharge Instructions: Wash your hands with soap and water. Remove old dressing, discard into plastic bag and place into trash. Cleanse the wound with Wound Cleanser prior to applying a clean dressing using gauze sponges, not tissues or cotton balls. Do not scrub or use excessive force. Pat dry using gauze sponges, not tissue or cotton balls. Prim Dressing: Hydrofera Blue Ready Transfer Foam, 2.5x2.5 (in/in) Every Other Day/30 Days ary Discharge Instructions: Apply Hydrofera Blue Ready to wound bed as directed Secondary Dressing: (BORDER) Zetuvit Plus SILICONE BORDER Dressing 4x4 (in/in) (Generic) Every Other Day/30 Days Discharge Instructions: Please do not put silicone bordered dressings under wraps. Use non-bordered dressing only. WOUND #3: - Lower Leg Wound Laterality: Left, Lateral Cleanser: Byram Ancillary Kit - 15 Day Supply Every Other Day/30 Days Discharge Instructions: Use supplies as instructed; Kit contains: (15)  Saline Bullets; (15) 3x3 Gauze; 15 pr Gloves [name] [name], [name] [name] ([id number]) [id number] Page 9 of 10 Cleanser: Soap and Water Every Other Day/30 Days Discharge Instructions: Gently cleanse wound with antibacterial soap, rinse and pat dry prior to dressing wounds Cleanser: Wound Cleanser Every Other Day/30 Days Discharge Instructions: Wash your hands with soap and water. Remove old dressing, discard into plastic bag and place into trash. Cleanse the wound with Wound Cleanser prior to applying a  clean dressing using gauze sponges, not tissues or cotton balls. Do not scrub or use excessive force. Pat dry using gauze sponges, not tissue or cotton balls. Prim Dressing: Hydrofera Blue Ready Transfer Foam, 2.5x2.5 (in/in) Every Other Day/30 Days ary Discharge Instructions: Apply Hydrofera Blue Ready to wound bed as directed Secondary Dressing: (BORDER) Zetuvit Plus SILICONE BORDER Dressing 4x4 (in/in) (Generic) Every Other Day/30 Days Discharge Instructions: Please do not put silicone bordered dressings under wraps. Use non-bordered dressing only. 1. I am going to recommend that we have the patient going to continue to monitor for any signs of infection or worsening. I do believe that the [facility] is doing well. 2. With regard to the casting he tells me he does not really feel like he can do that we will get a try foot offloading boot I do given that from a shoe for this he is in to see about ordering it on [employer]. 3. I will recommend that the patient should continue to as well will utilize along with the [facility] with the bordered foam dressings to secure in place. We will see patient back for reevaluation in 1 week here in the clinic. If anything worsens or changes patient will contact our office for additional recommendations. Electronic Signature(s) Signed: 12/20/2022 2:49:36 PM By: Allen Derry PA-C Entered By: Allen Derry on 12/20/2022  14:49:35 -------------------------------------------------------------------------------- SuperBill Details Patient Name: Date of Service: [place], [place] 12/20/2022 Medical Record Number: [id number] Patient Account Number: [id number] Date of Birth/Sex: Treating RN: 06-28-1970 (52 y.o. 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 Chronic venous hypertension (idiopathic) with ulcer and inflammation of left lower extremity E11.622 Type 2 diabetes mellitus with other skin ulcer L97.822 Non-pressure chronic ulcer of other part of left lower leg with fat layer exposed I10 Essential (primary) hypertension Facility Procedures : CPT4 Code: [id number] Description: 11042 - DEB SUBQ TISSUE 20 SQ CM/< ICD-10 Diagnosis Description L97.522 Non-pressure chronic ulcer of other part of left foot with fat layer exposed L97.822 Non-pressure chronic ulcer of other part of left lower leg with fat layer expos Modifier: ed Quantity: 1 Physician Procedures : CPT4 Code Description Modifier 11042 11042 - WC PHYS SUBQ TISS 20 SQ CM ICD-10 Diagnosis Description L97.522 Non-pressure chronic ulcer of other part of left foot with fat layer exposed L97.822 Non-pressure chronic ulcer of other part of left lower leg  with fat layer exposed [name] [name], [name] [name] ([id number]) [id number] Page 10 of Quantity: 1 10 Electronic Signature(s) Signed: 12/20/2022 2:49:45 PM By: Allen Derry PA-C Entered By: Allen Derry on 12/20/2022 14:49:45

## 2022-12-22 ENCOUNTER — Ambulatory Visit: Payer: BC Managed Care – PPO | Admitting: Physician Assistant

## 2022-12-22 NOTE — Progress Notes (Signed)
[name], [name] ([id number]) [id number] Page 1 of 10 Visit Report for 12/20/2022 Arrival Information Details Patient Name: Date of Service: [name] [name], [place] 12/20/2022 12:15 PM Medical Record Number: [id number] Patient Account Number: [id number] Date of Birth/Sex: Treating RN: 1970-07-03 (52 y.o. [name] [name] Primary Care Raynesha Tiedt: Einar Crow Other Clinician: Referring Lyndal Alamillo: Treating Jai Steil/Extender: Johny Shears in Treatment: 7 Visit Information History Since Last Visit Added or deleted any medications: No Patient Arrived: Ambulatory Any new allergies or adverse reactions: No Arrival Time: 12:23 Had a fall or experienced change in No Accompanied By: self activities of daily living that may affect Transfer Assistance: None risk of falls: Patient Identification Verified: Yes Hospitalized since last visit: No Secondary Verification Process Completed: Yes Has Dressing in Place as Prescribed: Yes Patient Requires Transmission-Based Precautions: No Pain Present Now: No Patient Has Alerts: Yes Patient Alerts: type 2 diabetic ABI 11/01/22 [place] Electronic Signature(s) Signed: 12/21/2022 4:17:14 PM By: Midge Aver MSN RN CNS WTA Entered By: Midge Aver on 12/20/2022 09:35:36 -------------------------------------------------------------------------------- Clinic Level of Care Assessment Details Patient Name: Date of Service: [name] [name], [place] 12/20/2022 12:15 PM Medical Record Number: [id number] Patient Account Number: [id number] Date of Birth/Sex: Treating RN: January 14, 1970 (52 y.o. [name] [name] Primary Care Kasmira Cacioppo: Einar Crow Other Clinician: Referring Marvelle Caudill: Treating Beckem Tomberlin/Extender: Johny Shears in Treatment: 7 Clinic Level of Care Assessment Items TOOL 1 Quantity Score []  - 0 Use when EandM and Procedure is performed on INITIAL visit ASSESSMENTS - Nursing  Assessment / Reassessment []  - 0 General Physical Exam (combine w/ comprehensive assessment (listed just below) when performed on new pt. evals) []  - 0 Comprehensive Assessment (HX, ROS, Risk Assessments, Wounds Hx, etc.) ASSESSMENTS - Wound and Skin Assessment / Reassessment []  - 0 Dermatologic / Skin Assessment (not related to wound area) [name], [name] ([id number]) [id number] Page 2 of 10 ASSESSMENTS - Ostomy and/or Continence Assessment and Care []  - 0 Incontinence Assessment and Management []  - 0 Ostomy Care Assessment and Management (repouching, etc.) PROCESS - Coordination of Care []  - 0 Simple Patient / Family Education for ongoing care []  - 0 Complex (extensive) Patient / Family Education for ongoing care []  - 0 Staff obtains Chiropractor, Records, T Results / Process Orders est []  - 0 Staff telephones HHA, Nursing Homes / Clarify orders / etc []  - 0 Routine Transfer to another Facility (non-emergent condition) []  - 0 Routine Hospital Admission (non-emergent condition) []  - 0 New Admissions / Manufacturing engineer / Ordering NPWT Apligraf, etc. , []  - 0 Emergency Hospital Admission (emergent condition) PROCESS - Special Needs []  - 0 Pediatric / Minor Patient Management []  - 0 Isolation Patient Management []  - 0 Hearing / Language / Visual special needs []  - 0 Assessment of Community assistance (transportation, D/C planning, etc.) []  - 0 Additional assistance / Altered mentation []  - 0 Support Surface(s) Assessment (bed, cushion, seat, etc.) INTERVENTIONS - Miscellaneous []  - 0 External ear exam []  - 0 Patient Transfer (multiple staff / Nurse, adult / Similar devices) []  - 0 Simple Staple / Suture removal (25 or less) []  - 0 Complex Staple / Suture removal (26 or more) []  - 0 Hypo/Hyperglycemic Management (do not check if billed separately) []  - 0 Ankle / Brachial Index (ABI) - do not check if billed separately Has the  patient been seen at the hospital within the last three years: Yes Total Score: 0 Level Of Care: ____ Electronic Signature(s) Signed: 12/21/2022  4:17:14 PM By: Midge Aver MSN RN CNS WTA Entered By: Midge Aver on 12/20/2022 10:06:15 -------------------------------------------------------------------------------- Encounter Discharge Information Details Patient Name: Date of Service: [name] [name], [place] 12/20/2022 12:15 PM Medical Record Number: [id number] Patient Account Number: [id number] Date of Birth/Sex: Treating RN: 1970-01-17 (52 y.o. [name] [name] Primary Care Dontrel Smethers: Einar Crow Other Clinician: Referring Erabella Kuipers: Treating Sanjuanita Condrey/Extender: Johny Shears in Treatment: 7 Encounter Discharge Information Items Post Procedure RONIEL, MAYTON ([id number]) [id number] Page 3 of 10 Discharge Condition: Stable Temperature (F): 97.5 Ambulatory Status: Ambulatory Pulse (bpm): 90 Discharge Destination: Home Respiratory Rate (breaths/min): 18 Transportation: Private Auto Blood Pressure (mmHg): 190/98 Accompanied By: self Schedule Follow-up Appointment: Yes Clinical Summary of Care: Electronic Signature(s) Signed: 12/21/2022 4:17:14 PM By: Midge Aver MSN RN CNS WTA Entered By: Midge Aver on 12/20/2022 10:07:47 -------------------------------------------------------------------------------- Lower Extremity Assessment Details Patient Name: Date of Service: [name] [name], [place] 12/20/2022 12:15 PM Medical Record Number: [id number] Patient Account Number: [id number] Date of Birth/Sex: Treating RN: 28-Feb-1970 (52 y.o. [name] [name] Primary Care Kel Senn: Einar Crow Other Clinician: Referring Vickii Volland: Treating Samyah Bilbo/Extender: Johny Shears in Treatment: 7 Edema Assessment Assessed: [Left: Yes] [Right: No] Edema: [Left: Ye] [Right: s] Calf Left: Right: Point of  Measurement: 39 cm From Medial Instep 55 cm Ankle Left: Right: Point of Measurement: 12 cm From Medial Instep 31 cm Vascular Assessment Pulses: Dorsalis Pedis Palpable: [Left:Yes] Extremity colors, hair growth, and conditions: Extremity Color: [Left:Hyperpigmented] Hair Growth on Extremity: [Left:No] Temperature of Extremity: [Left:Warm] Capillary Refill: [Left:< 3 seconds] Dependent Rubor: [Left:No] Blanched when Elevated: [Left:No No] Toe Nail Assessment Left: Right: Thick: Yes Discolored: Yes Deformed: Yes Improper Length and Hygiene: Yes Electronic Signature(s) Signed: 12/21/2022 4:17:14 PM By: Midge Aver MSN RN CNS WTA Entered By: Midge Aver on 12/20/2022 09:47:06 Mellody Life ([id number]) [id number] Page 4 of 10 -------------------------------------------------------------------------------- Multi Wound Chart Details Patient Name: Date of Service: [name] [name], [place] 12/20/2022 12:15 PM Medical Record Number: [id number] Patient Account Number: [id number] Date of Birth/Sex: Treating RN: 05/15/1970 (52 y.o. [name] [name] Primary Care Lonnell Chaput: Einar Crow Other Clinician: Referring Garry Bochicchio: Treating Hooria Gasparini/Extender: Johny Shears in Treatment: 7 Vital Signs Height(in): 74 Pulse(bpm): 90 Weight(lbs): 546 Blood Pressure(mmHg): 190/98 Body Mass Index(BMI): 70.1 Temperature(F): 97.5 Respiratory Rate(breaths/min): 18 [1:Photos:] [N/A:N/A] Left, Plantar Foot Left, Lateral Lower Leg N/A Wound Location: Gradually Appeared Gradually Appeared N/A Wounding Event: Diabetic Wound/Ulcer of the Lower Diabetic Wound/Ulcer of the Lower N/A Primary Etiology: Extremity Extremity Hypertension, Type II Diabetes, Gout Hypertension, Type II Diabetes, Gout N/A Comorbid History: 08/19/2022 08/19/2022 N/A Date Acquired: 7 7 N/A Weeks of Treatment: Open Open N/A Wound Status: No No N/A Wound  Recurrence: 1x1.4x0.2 1x1.2x0.1 N/A Measurements L x W x D (cm) 1.1 0.942 N/A A (cm) : rea 0.22 0.094 N/A Volume (cm) : -45.90% 33.40% N/A % Reduction in A rea: 2.70% 66.80% N/A % Reduction in Volume: 11 Starting Position 1 (o'clock): 1 Ending Position 1 (o'clock): 0.8 Maximum Distance 1 (cm): Yes N/A N/A Undermining: Grade 1 Grade 1 N/A Classification: Medium Medium N/A Exudate A mount: Serosanguineous Serosanguineous N/A Exudate Type: red, brown red, brown N/A Exudate Color: Large (67-100%) Large (67-100%) N/A Granulation A mount: Red Red N/A Granulation Quality: Small (1-33%) Small (1-33%) N/A Necrotic A mount: Fat Layer (Subcutaneous Tissue): Yes Fat Layer (Subcutaneous Tissue): Yes N/A Exposed Structures: None None N/A Epithelialization: Treatment Notes Electronic Signature(s) Signed: 12/21/2022 4:17:14 PM By:  Midge Aver MSN RN CNS WTA Entered By: Midge Aver on 12/20/2022 10:01:58 Mellody Life ([id number]) [id number] Page 5 of 10 -------------------------------------------------------------------------------- Multi-Disciplinary Care Plan Details Patient Name: Date of Service: [name] [name], [place] 12/20/2022 12:15 PM Medical Record Number: [id number] Patient Account Number: [id number] Date of Birth/Sex: Treating RN: 01/22/1970 (52 y.o. [name] [name] Primary Care Naylene Foell: Einar Crow Other Clinician: Referring Jahshua Bonito: Treating Elwin Tsou/Extender: Johny Shears in Treatment: 7 Active Inactive Necrotic Tissue Nursing Diagnoses: Impaired tissue integrity related to necrotic/devitalized tissue Knowledge deficit related to management of necrotic/devitalized tissue Goals: Necrotic/devitalized tissue will be minimized in the wound bed Date Initiated: 11/01/2022 Target Resolution Date: 12/27/2022 Goal Status: Active Patient/caregiver will verbalize understanding of reason and process for  debridement of necrotic tissue Date Initiated: 11/01/2022 Date Inactivated: 11/01/2022 Target Resolution Date: 11/01/2022 Goal Status: Met Interventions: Assess patient pain level pre-, during and post procedure and prior to discharge Provide education on necrotic tissue and debridement process Treatment Activities: Apply topical anesthetic as ordered : 11/01/2022 Excisional debridement : 11/01/2022 Notes: Wound/Skin Impairment Nursing Diagnoses: Impaired tissue integrity Knowledge deficit related to ulceration/compromised skin integrity Goals: Ulcer/skin breakdown will have a volume reduction of 30% by week 4 Date Initiated: 11/01/2022 Date Inactivated: 12/07/2022 Target Resolution Date: 11/29/2022 Goal Status: Unmet Unmet Reason: comorbidities Ulcer/skin breakdown will have a volume reduction of 50% by week 8 Date Initiated: 11/01/2022 Target Resolution Date: 12/27/2022 Goal Status: Active Ulcer/skin breakdown will have a volume reduction of 80% by week 12 Date Initiated: 11/01/2022 Target Resolution Date: 01/24/2023 Goal Status: Active Ulcer/skin breakdown will heal within 14 weeks Date Initiated: 11/01/2022 Target Resolution Date: 02/07/2023 Goal Status: Active Interventions: Assess patient/caregiver ability to obtain necessary supplies Assess patient/caregiver ability to perform ulcer/skin care regimen upon admission and as needed Assess ulceration(s) every visit Provide education on ulcer and skin care JAHEM, DELEEUW ([id number]) [id number] Page 6 of 10 Notes: Electronic Signature(s) Signed: 12/21/2022 4:17:14 PM By: Midge Aver MSN RN CNS WTA Entered By: Midge Aver on 12/20/2022 10:06:32 -------------------------------------------------------------------------------- Pain Assessment Details Patient Name: Date of Service: [name] [name], [place] 12/20/2022 12:15 PM Medical Record Number: [id number] Patient Account Number: [id number] Date  of Birth/Sex: Treating RN: 1970-12-16 (52 y.o. [name] [name] Primary Care Steven Veazie: Einar Crow Other Clinician: Referring Solstice Lastinger: Treating Karthikeya Funke/Extender: Johny Shears in Treatment: 7 Active Problems Location of Pain Severity and Description of Pain Patient Has Paino No Site Locations Pain Management and Medication Current Pain Management: Electronic Signature(s) Signed: 12/21/2022 4:17:14 PM By: Midge Aver MSN RN CNS WTA Entered By: Midge Aver on 12/20/2022 09:36:11 Mellody Life ([id number]) [id number] Page 7 of 10 -------------------------------------------------------------------------------- Patient/Caregiver Education Details Patient Name: Date of Service: [name] 12/16/2024andnbsp12:15 PM Medical Record Number: [id number] Patient Account Number: [id number] Date of Birth/Gender: Treating RN: 1971-01-04 (52 y.o. [name] [name] Primary Care Physician: Einar Crow Other Clinician: Referring Physician: Treating Physician/Extender: Johny Shears in Treatment: 7 Education Assessment Education Provided To: Patient Education Topics Provided Wound Debridement: Handouts: Wound Debridement Methods: Explain/Verbal Responses: State content correctly Wound/Skin Impairment: Handouts: Caring for Your Ulcer Methods: Explain/Verbal Responses: State content correctly Electronic Signature(s) Signed: 12/21/2022 4:17:14 PM By: Midge Aver MSN RN CNS WTA Entered By: Midge Aver on 12/20/2022 10:06:47 -------------------------------------------------------------------------------- Wound Assessment Details Patient Name: Date of Service: [name] [name], [place] 12/20/2022 12:15 PM Medical Record Number: [id number] Patient Account Number: [id number] Date of Birth/Sex: Treating RN: March 16, 1970 (52  y.o. [name] [name] Primary Care Dennie Moltz: Einar Crow Other  Clinician: Referring Heide Brossart: Treating Alyze Lauf/Extender: Johny Shears in Treatment: 7 Wound Status Wound Number: 1 Primary Etiology: Diabetic Wound/Ulcer of the Lower Extremity Wound Location: Left, Plantar Foot Wound Status: Open Wounding Event: Gradually Appeared Comorbid History: Hypertension, Type II Diabetes, Gout Date Acquired: 08/19/2022 Weeks Of Treatment: 7 Clustered Wound: No Photos CAYNE, KLESS ([id number]) [id number] Page 8 of 10 Wound Measurements Length: (cm) 1 Width: (cm) 1.4 Depth: (cm) 0.2 Area: (cm) 1.1 Volume: (cm) 0.22 % Reduction in Area: -45.9% % Reduction in Volume: 2.7% Epithelialization: None Undermining: Yes Starting Position (o'clock): 11 Ending Position (o'clock): 1 Maximum Distance: (cm) 0.8 Wound Description Classification: Grade 1 Exudate Amount: Medium Exudate Type: Serosanguineous Exudate Color: red, brown Foul Odor After Cleansing: No Slough/Fibrino Yes Wound Bed Granulation Amount: Large (67-100%) Exposed Structure Granulation Quality: Red Fat Layer (Subcutaneous Tissue) Exposed: Yes Necrotic Amount: Small (1-33%) Necrotic Quality: Adherent Slough Treatment Notes Wound #1 (Foot) Wound Laterality: Plantar, Left Cleanser Byram Ancillary Kit - 15 Day Supply Discharge Instruction: Use supplies as instructed; Kit contains: (15) Saline Bullets; (15) 3x3 Gauze; 15 pr Gloves Soap and Water Discharge Instruction: Gently cleanse wound with antibacterial soap, rinse and pat dry prior to dressing wounds Wound Cleanser Discharge Instruction: Wash your hands with soap and water. Remove old dressing, discard into plastic bag and place into trash. Cleanse the wound with Wound Cleanser prior to applying a clean dressing using gauze sponges, not tissues or cotton balls. Do not scrub or use excessive force. Pat dry using gauze sponges, not tissue or cotton balls. Peri-Wound  Care Topical Primary Dressing Hydrofera Blue Ready Transfer Foam, 2.5x2.5 (in/in) Discharge Instruction: Apply Hydrofera Blue Ready to wound bed as directed Secondary Dressing (BORDER) Zetuvit Plus SILICONE BORDER Dressing 4x4 (in/in) Discharge Instruction: Please do not put silicone bordered dressings under wraps. Use non-bordered dressing only. Secured With Compression Wrap Compression Stockings Facilities manager) Signed: 12/21/2022 4:17:14 PM By: Midge Aver MSN RN CNS WTA Entered By: Midge Aver on 12/20/2022 09:45:30 Mellody Life ([id number]) [id number] Page 9 of 10 -------------------------------------------------------------------------------- Wound Assessment Details Patient Name: Date of Service: [name] [name], [place] 12/20/2022 12:15 PM Medical Record Number: [id number] Patient Account Number: [id number] Date of Birth/Sex: Treating RN: 07/23/1970 (52 y.o. [name] [name] Primary Care Karlita Lichtman: Einar Crow Other Clinician: Referring Md Smola: Treating Daniesha Driver/Extender: Johny Shears in Treatment: 7 Wound Status Wound Number: 3 Primary Etiology: Diabetic Wound/Ulcer of the Lower Extremity Wound Location: Left, Lateral Lower Leg Wound Status: Open Wounding Event: Gradually Appeared Comorbid History: Hypertension, Type II Diabetes, Gout Date Acquired: 08/19/2022 Weeks Of Treatment: 7 Clustered Wound: No Photos Wound Measurements Length: (cm) 1 Width: (cm) 1.2 Depth: (cm) 0.1 Area: (cm) 0.942 Volume: (cm) 0.094 % Reduction in Area: 33.4% % Reduction in Volume: 66.8% Epithelialization: None Wound Description Classification: Grade 1 Exudate Amount: Medium Exudate Type: Serosanguineous Exudate Color: red, brown Foul Odor After Cleansing: No Slough/Fibrino Yes Wound Bed Granulation Amount: Large (67-100%) Exposed Structure Granulation Quality: Red Fat Layer (Subcutaneous Tissue)  Exposed: Yes Necrotic Amount: Small (1-33%) Necrotic Quality: Adherent Slough Treatment Notes Wound #3 (Lower Leg) Wound Laterality: Left, Lateral Cleanser Byram Ancillary Kit - 15 Day Supply Discharge Instruction: Use supplies as instructed; Kit contains: (15) Saline Bullets; (15) 3x3 Gauze; 15 pr Gloves Soap and Water Discharge Instruction: Gently cleanse wound with antibacterial soap, rinse and pat dry prior to dressing wounds Wound Cleanser Discharge Instruction: Wash your hands  with soap and water. Remove old dressing, discard into plastic bag and place into trash. Cleanse the DETRICK, LOCKERMAN ([id number]) [id number] Page 10 of 10 wound with Wound Cleanser prior to applying a clean dressing using gauze sponges, not tissues or cotton balls. Do not scrub or use excessive force. Pat dry using gauze sponges, not tissue or cotton balls. Peri-Wound Care Topical Primary Dressing Hydrofera Blue Ready Transfer Foam, 2.5x2.5 (in/in) Discharge Instruction: Apply Hydrofera Blue Ready to wound bed as directed Secondary Dressing (BORDER) Zetuvit Plus SILICONE BORDER Dressing 4x4 (in/in) Discharge Instruction: Please do not put silicone bordered dressings under wraps. Use non-bordered dressing only. Secured With Compression Wrap Compression Stockings Facilities manager) Signed: 12/21/2022 4:17:14 PM By: Midge Aver MSN RN CNS WTA Entered By: Midge Aver on 12/20/2022 09:46:04 -------------------------------------------------------------------------------- Vitals Details Patient Name: Date of Service: [name] [name], [place] 12/20/2022 12:15 PM Medical Record Number: [id number] Patient Account Number: [id number] Date of Birth/Sex: Treating RN: [birth year] (53 y.o. [name] [name] Primary Care Bryelle Spiewak: Einar Crow Other Clinician: Referring Errick Salts: Treating Jarrad Mclees/Extender: Johny Shears in Treatment: 7 Vital  Signs Time Taken: 12:35 Temperature (F): 97.5 Height (in): 74 Pulse (bpm): 90 Weight (lbs): 546 Respiratory Rate (breaths/min): 18 Body Mass Index (BMI): 70.1 Blood Pressure (mmHg): 190/98 Reference Range: 80 - 120 mg / dl Electronic Signature(s) Signed: 12/21/2022 4:17:14 PM By: Midge Aver MSN RN CNS WTA Entered By: Midge Aver on 12/20/2022 09:36:01

## 2022-12-27 ENCOUNTER — Ambulatory Visit: Payer: BC Managed Care – PPO | Admitting: Physician Assistant

## 2023-01-03 ENCOUNTER — Encounter: Payer: BC Managed Care – PPO | Admitting: Internal Medicine

## 2023-01-03 DIAGNOSIS — E11621 Type 2 diabetes mellitus with foot ulcer: Secondary | ICD-10-CM | POA: Diagnosis not present

## 2023-01-11 ENCOUNTER — Encounter: Payer: 59 | Attending: Physician Assistant | Admitting: Physician Assistant

## 2023-01-11 DIAGNOSIS — E11621 Type 2 diabetes mellitus with foot ulcer: Secondary | ICD-10-CM | POA: Diagnosis present

## 2023-01-11 DIAGNOSIS — I87332 Chronic venous hypertension (idiopathic) with ulcer and inflammation of left lower extremity: Secondary | ICD-10-CM | POA: Diagnosis not present

## 2023-01-11 DIAGNOSIS — I10 Essential (primary) hypertension: Secondary | ICD-10-CM | POA: Diagnosis not present

## 2023-01-11 DIAGNOSIS — E11622 Type 2 diabetes mellitus with other skin ulcer: Secondary | ICD-10-CM | POA: Insufficient documentation

## 2023-01-11 DIAGNOSIS — I890 Lymphedema, not elsewhere classified: Secondary | ICD-10-CM | POA: Diagnosis not present

## 2023-01-11 DIAGNOSIS — L97522 Non-pressure chronic ulcer of other part of left foot with fat layer exposed: Secondary | ICD-10-CM | POA: Diagnosis not present

## 2023-01-11 DIAGNOSIS — L97822 Non-pressure chronic ulcer of other part of left lower leg with fat layer exposed: Secondary | ICD-10-CM | POA: Diagnosis not present

## 2023-01-11 NOTE — Progress Notes (Addendum)
 Mault, Piers ([id number]) [id number] Page 1 of 10 Visit Report for 01/11/2023 Chief Complaint Document Details Patient Name: Date of Service: [name] [name], [name] [name] [place] 01/11/2023 7:45 A M Medical Record Number: [id number] Patient Account Number: [id number] Date of Birth/Sex: Treating RN: 14-Jan-1970 (53 [name].o. [name] Primary Care Provider: Lenon Na Other Clinician: Referring Provider: Treating Provider/Extender: Bethena Andre Lenon Na Devra in Treatment: 10 Information Obtained from: Patient Chief Complaint Left foot and Left LE Ulcers Electronic Signature(s) Signed: 01/11/2023 8:00:34 AM By: Bethena Andre PA-C Entered By: Bethena Andre on 01/11/2023 05:00:33 -------------------------------------------------------------------------------- Debridement Details Patient Name: Date of Service: [name] [name], [name] [name] [place] 01/11/2023 7:45 A M Medical Record Number: [id number] Patient Account Number: [id number] Date of Birth/Sex: Treating RN: [birth year] (65 [name].o. NETTY Claudene Blossom Primary Care Provider: Lenon Na Other Clinician: Referring Provider: Treating Provider/Extender: Bethena Andre Lenon Na Devra in Treatment: 10 Debridement Performed for Assessment: Wound #1 Left,Plantar Foot Performed By: Physician Bethena Andre, PA-C The following information was scribed by: Claudene Blossom The information was scribed for: Bethena Andre Debridement Type: Debridement Severity of Tissue Pre Debridement: Fat layer exposed Level of Consciousness (Pre-procedure): Awake and Alert Pre-procedure Verification/Time Out Yes - 08:28 Taken: Start Time: 08:28 Pain Control: Lidocaine 4% T opical Solution Percent of Wound Bed Debrided: 100% T Area Debrided (cm): otal 1.33 Tissue and other material debrided: Viable, Non-Viable, Callus, Slough, Subcutaneous, Slough Level: Skin/Subcutaneous Tissue Debridement Description: Excisional Instrument:  Curette Bleeding: Moderate Nasser, Mitsugi ([id number]) [id number] Page 2 of 10 Hemostasis Achieved: Silver Nitrate Procedural Pain: 0 Post Procedural Pain: 0 Response to Treatment: Procedure was tolerated well Level of Consciousness (Post- Awake and Alert procedure): Post Debridement Measurements of Total Wound Length: (cm) 1 Width: (cm) 1.7 Depth: (cm) 0.2 Volume: (cm) 0.267 Character of Wound/Ulcer Post Debridement: Stable Severity of Tissue Post Debridement: Fat layer exposed Post Procedure Diagnosis Same as Pre-procedure Electronic Signature(s) Signed: 01/12/2023 5:32:36 PM By: Claudene Blossom MSN RN CNS WTA Signed: 01/12/2023 6:16:28 PM By: Bethena Andre PA-C Entered By: Claudene Blossom on 01/11/2023 05:29:05 -------------------------------------------------------------------------------- Debridement Details Patient Name: Date of Service: [name] [name], [name] [name] [place] 01/11/2023 7:45 A M Medical Record Number: [id number] Patient Account Number: [id number] Date of Birth/Sex: Treating RN: 01-29-1970 (24 [name].o. NETTY Claudene Blossom Primary Care Provider: Lenon Na Other Clinician: Referring Provider: Treating Provider/Extender: Bethena Andre Lenon Na Devra in Treatment: 10 Debridement Performed for Assessment: Wound #3 Left,Lateral Lower Leg Performed By: Physician Bethena Andre, PA-C Debridement Type: Debridement Severity of Tissue Pre Debridement: Fat layer exposed Level of Consciousness (Pre-procedure): Awake and Alert Pre-procedure Verification/Time Out Yes - 08:28 Taken: Start Time: 08:28 Pain Control: Lidocaine 4% T opical Solution Percent of Wound Bed Debrided: 100% T Area Debrided (cm): otal 0.94 Tissue and other material debrided: Viable, Non-Viable, Slough, Subcutaneous, Slough Level: Skin/Subcutaneous Tissue Debridement Description: Excisional Instrument: Curette Bleeding: Moderate Hemostasis Achieved: Pressure Procedural Pain: 0 Post  Procedural Pain: 0 Response to Treatment: Procedure was tolerated well Level of Consciousness (Post- Awake and Alert procedure): Post Debridement Measurements of Total Wound Length: (cm) 1.5 Width: (cm) 0.8 Depth: (cm) 0.1 Volume: (cm) 0.094 Character of Wound/Ulcer Post Debridement: Stable Severity of Tissue Post Debridement: Fat layer exposed Sok, Mason ([id number]) [id number] Page 3 of 10 Post Procedure Diagnosis Same as Pre-procedure Electronic Signature(s) Signed: 01/12/2023 5:32:36 PM By: Claudene Blossom MSN RN CNS WTA Signed: 01/12/2023 6:16:28 PM By: Bethena Andre PA-C Entered By: Claudene Blossom on 01/11/2023 05:29:51 -------------------------------------------------------------------------------- HPI Details Patient Name: Date of  Service: [name] [name], [name] [name] LV [place] 01/11/2023 7:45 A M Medical Record Number: [id number] Patient Account Number: [id number] Date of Birth/Sex: Treating RN: 11-Oct-1970 (36 [name].o. [name] Primary Care Provider: Lenon Na Other Clinician: Referring Provider: Treating Provider/Extender: Bethena Andre Lenon Na Devra in Treatment: 10 History of Present Illness Chronic/Inactive Conditions Condition 1: 11-01-2022 on evaluation today patient's ABIs were unfortunately noncompressible bilaterally though he has good pulses 1+ equal bilaterally when I palpated and the reason I could not feel them better it appears as because of the fact that he is having a lot of issues with swelling and again due to his size in general. Nonetheless his capillary refill was actually less than a second and it filled really quickly after testing and I feel like that his extremities were nice and warm he had hair growth as well in the lower extremities I do not think he needs to go for formal evaluation at this point. HPI Description: 11-01-2022 upon evaluation today patient's wound bed actually showed signs of having multiple wounds over  the left lower extremity 2 in fact on the leg 1 on the plantar foot. He tells me he does not really know how long the foot is been present although he states his wife noted it 2 weeks ago. Obviously looking at this wound I think this is much longer than 2 weeks although he does not have feeling he is a diabetic type II with diabetic neuropathy and this subsequently means that he does not really know exactly when this occurred. He is not having any pain obviously at this point in the leg ulcers he tells me started out as blisters and again the foot he really does not know how that started. He does have a significant amount of callus buildup in the foot region. Patient does have a history as noted above diabetes mellitus type 2 with a hemoglobin A1c of 7.8 that was on October 25, 2022. He also has a history of hypertension and obesity but no other major medical problems that would particularly contribute to this. Well currently in regard to 11-09-2022 upon evaluation today patient appears to be doing his wounds as far as being stable for the most part although the left anterior leg actually showed signs of cellulitis this is not doing as well as I like to see it. Has not been using Tubigrip quite right. I think we need to try to get this infection under control before all else. He voiced understanding. With that being said I do think that at this point we are going to mark where the erythema redness is visually and tactile we are going to monitor and make sure that this does not spread if it does he needs to go to the ER soon as possible for now I am going to send in a prescription for Levaquin for him which I think is gena be the best way to go and he is in agreement with that plan. 11-18-2022 upon evaluation today patient appears to be doing decently well currently in regard to his leg. With that being said I feel like the erythema is a little bit better we're not completely resolved yet but I definitely  see signs of improvement compared to where we were. Fortunately I do not see any evidence of active infection systemically which is great news. 12-07-2022 upon evaluation today patient appears to be doing well currently in regard to his wound on the leg which one of them is  healed the other seems to be improving significantly at this point. Fortunately I do not see any signs of active infection at this time. No fevers, chills, nausea, vomiting, or diarrhea. 12-20-2022 upon evaluation today patient appears to be doing well currently in regard to his wound. He has been tolerating the dressing changes without complication. With that being said I do see some signs of improvement especially in regard to the leg it looks excellent. In regard to the foot this is. A little bit better although it still not tremendously smaller. We have been discussing doing a cast but he is really not interested 12/30; patient arrives today without anything on the left leg from a compression point of view. He says that the Tubigrip made the swelling in his left leg worse which I find unlikely and I told him so. He has a small wound on the left lateral lower leg in the setting of chronic venous insufficiency and secondary lymphedema. The other wound here is also worrisome the left fifth metatarsal plantar head. This had significant undermining from about 6-10 o'clock. I believe we ordered him a psychologist, forensic and it came to his house. He did not bring it in but said it was too small for his size 13 to 14 foot. He came back in and his surgical shoe. His wife has been changing the dressings at home 01-11-23 upon evaluation today patient appears to be doing pretty well in regard to his wounds I do not see any signs of significant infection which is good news and in general I do believe that were making some headway here towards closure which is excellent as well. No fevers, chills, nausea, vomiting, or diarrhea. He feels like the wrap  did well for him. Electronic Signature(s) Signed: 01/11/2023 5:42:23 PM By: Bethena Ferraris PA-C Entered By: Bethena Ferraris on 01/11/2023 14:42:23 [name] [name], [name] [name] ([id number]) [id number] Page 4 of 10 -------------------------------------------------------------------------------- Physical Exam Details Patient Name: Date of Service: [name] [name], [place] 01/11/2023 7:45 A M Medical Record Number: [id number] Patient Account Number: [id number] Date of Birth/Sex: Treating RN: 1970/10/14 (81 [name].o. [name] Primary Care Provider: Lenon Na Other Clinician: Referring Provider: Treating Provider/Extender: Bethena Ferraris Lenon Na Devra in Treatment: 10 Constitutional Well-nourished and well-hydrated in no acute distress. Respiratory normal breathing without difficulty. Psychiatric this patient is able to make decisions and demonstrates good insight into disease process. Alert and Oriented x 3. pleasant and cooperative. Notes Upon inspection patient's wound bed actually showed signs of good granulation epithelization at this point. Fortunately I do not see any signs of worsening overall and I believe that the patient is making headway here towards closure. Electronic Signature(s) Signed: 01/11/2023 5:42:35 PM By: Bethena Ferraris PA-C Entered By: Bethena Ferraris on 01/11/2023 14:42:35 -------------------------------------------------------------------------------- Physician Orders Details Patient Name: Date of Service: [name] [name], [place] 01/11/2023 7:45 A M Medical Record Number: [id number] Patient Account Number: [id number] Date of Birth/Sex: Treating RN: [birth year] (51 [name].o. NETTY Claudene Blossom Primary Care Provider: Lenon Na Other Clinician: Referring Provider: Treating Provider/Extender: Bethena Ferraris Lenon Na Devra in Treatment: 10 The following information was scribed by: Claudene Blossom The information was scribed for: Bethena Ferraris Verbal  / Phone Orders: No Diagnosis Coding ICD-10 Coding Code Description E11.621 Type 2 diabetes mellitus with foot ulcer L97.522 Non-pressure chronic ulcer of other part of left foot with fat layer exposed L97.822 Non-pressure chronic ulcer of other part of left lower leg with fat layer exposed I87.332 Chronic venous  hypertension (idiopathic) with ulcer and inflammation of left lower extremity [name] [name], [name] [name] ([id number]) [id number] Page 5 of 10 E11.622 Type 2 diabetes mellitus with other skin ulcer I10 Essential (primary) hypertension Follow-up Appointments Return Appointment in 1 week. Bathing/ Shower/ Hygiene May shower with wound dressing protected with water repellent cover or cast protector. No tub bath. Anesthetic (Use 'Patient Medications' Section for Anesthetic Order Entry) Lidocaine applied to wound bed Edema Control - Orders / Instructions UrgoK2 40mmhg - Large Elevate, Exercise Daily and A void Standing for Long Periods of Time. Elevate leg(s) parallel to the floor when sitting. DO YOUR BEST to sleep in the bed at night. DO NOT sleep in your recliner. Long hours of sitting in a recliner leads to swelling of the legs and/or potential wounds on your backside. Off-Loading Open toe surgical shoe with peg assist. - left foot Other: - dial soap original gold recommended Medications-Please add to medication list. ntibiotics - continue and finish as prescribed P.O. A Wound Treatment Wound #1 - Foot Wound Laterality: Plantar, Left Cleanser: Byram Ancillary Kit - 15 Day Supply (Generic) Every Other Day/30 Days Discharge Instructions: Use supplies as instructed; Kit contains: (15) Saline Bullets; (15) 3x3 Gauze; 15 pr Gloves Cleanser: Soap and Water Every Other Day/30 Days Discharge Instructions: Gently cleanse wound with antibacterial soap, rinse and pat dry prior to dressing wounds Cleanser: Wound Cleanser Every Other Day/30 Days Discharge Instructions:  Wash your hands with soap and water. Remove old dressing, discard into plastic bag and place into trash. Cleanse the wound with Wound Cleanser prior to applying a clean dressing using gauze sponges, not tissues or cotton balls. Do not scrub or use excessive force. Pat dry using gauze sponges, not tissue or cotton balls. Prim Dressing: Hydrofera Blue Ready Transfer Foam, 2.5x2.5 (in/in) Every Other Day/30 Days ary Discharge Instructions: Apply Hydrofera Blue Ready to wound bed as directed Secondary Dressing: ABD Pad 5x9 (in/in) Every Other Day/30 Days Discharge Instructions: Cover with ABD pad Compression Wrap: Urgo K2, two layer compression system, large Every Other Day/30 Days Wound #3 - Lower Leg Wound Laterality: Left, Lateral Cleanser: Byram Ancillary Kit - 15 Day Supply (Generic) Every Other Day/30 Days Discharge Instructions: Use supplies as instructed; Kit contains: (15) Saline Bullets; (15) 3x3 Gauze; 15 pr Gloves Cleanser: Soap and Water Every Other Day/30 Days Discharge Instructions: Gently cleanse wound with antibacterial soap, rinse and pat dry prior to dressing wounds Cleanser: Wound Cleanser Every Other Day/30 Days Discharge Instructions: Wash your hands with soap and water. Remove old dressing, discard into plastic bag and place into trash. Cleanse the wound with Wound Cleanser prior to applying a clean dressing using gauze sponges, not tissues or cotton balls. Do not scrub or use excessive force. Pat dry using gauze sponges, not tissue or cotton balls. Prim Dressing: Hydrofera Blue Ready Transfer Foam, 2.5x2.5 (in/in) Every Other Day/30 Days ary Discharge Instructions: Apply Hydrofera Blue Ready to wound bed as directed Secondary Dressing: ABD Pad 5x9 (in/in) Every Other Day/30 Days Discharge Instructions: Cover with ABD pad Compression Wrap: Urgo K2, two layer compression system, large Every Other Day/30 Days Electronic Signature(s) Signed: 01/12/2023 5:32:36 PM By: Claudene Blossom MSN RN CNS WTA Signed: 01/12/2023 6:16:28 PM By: Bethena Ferraris PA-C Entered By: Claudene Blossom on 01/11/2023 05:45:58 [name] [name], [name] [name] ([id number]) [id number] Page 6 of 10 -------------------------------------------------------------------------------- Problem List Details Patient Name: Date of Service: [name] [name], [place] 01/11/2023 7:45 A M Medical Record Number: [id number] Patient Account Number: [id number] Date of Birth/Sex:  Treating RN: 13-Aug-1970 (37 [name].o. [name] Primary Care Provider: Lenon Na Other Clinician: Referring Provider: Treating Provider/Extender: Bethena Andre Lenon Na Devra in Treatment: 10 Active Problems ICD-10 Encounter Code Description Active Date MDM Diagnosis E11.621 Type 2 diabetes mellitus with foot ulcer 11/01/2022 No Yes L97.522 Non-pressure chronic ulcer of other part of left foot with fat layer exposed 11/01/2022 No Yes L97.822 Non-pressure chronic ulcer of other part of left lower leg with fat layer 11/01/2022 No Yes exposed I87.332 Chronic venous hypertension (idiopathic) with ulcer and inflammation of left 11/01/2022 No Yes lower extremity E11.622 Type 2 diabetes mellitus with other skin ulcer 11/01/2022 No Yes I10 Essential (primary) hypertension 11/01/2022 No Yes Inactive Problems Resolved Problems Electronic Signature(s) Signed: 01/11/2023 8:00:27 AM By: Bethena Andre PA-C Entered By: Bethena Andre on 01/11/2023 05:00:26 [name] [name], [name] [name] ([id number]) [id number] Page 7 of 10 -------------------------------------------------------------------------------- Progress Note Details Patient Name: Date of Service: [name] [name], [place] LV [place] 01/11/2023 7:45 A M Medical Record Number: [id number] Patient Account Number: [id number] Date of Birth/Sex: Treating RN: 31-Dec-1970 (67 [name].o. [name] Primary Care Provider: Lenon Na Other Clinician: Referring  Provider: Treating Provider/Extender: Bethena Andre Lenon Na Devra in Treatment: 10 Subjective Chief Complaint Information obtained from Patient Left foot and Left LE Ulcers History of Present Illness (HPI) Chronic/Inactive Condition: 11-01-2022 on evaluation today patient's ABIs were unfortunately noncompressible bilaterally though he has good pulses 1+ equal bilaterally when I palpated and the reason I could not feel them better it appears as because of the fact that he is having a lot of issues with swelling and again due to his size in general. Nonetheless his capillary refill was actually less than a second and it filled really quickly after testing and I feel like that his extremities were nice and warm he had hair growth as well in the lower extremities I do not think he needs to go for formal evaluation at this point. 11-01-2022 upon evaluation today patient's wound bed actually showed signs of having multiple wounds over the left lower extremity 2 in fact on the leg 1 on the plantar foot. He tells me he does not really know how long the foot is been present although he states his wife noted it 2 weeks ago. Obviously looking at this wound I think this is much longer than 2 weeks although he does not have feeling he is a diabetic type II with diabetic neuropathy and this subsequently means that he does not really know exactly when this occurred. He is not having any pain obviously at this point in the leg ulcers he tells me started out as blisters and again the foot he really does not know how that started. He does have a significant amount of callus buildup in the foot region. Patient does have a history as noted above diabetes mellitus type 2 with a hemoglobin A1c of 7.8 that was on October 25, 2022. He also has a history of hypertension and obesity but no other major medical problems that would particularly contribute to this. Well currently in regard to 11-09-2022 upon  evaluation today patient appears to be doing his wounds as far as being stable for the most part although the left anterior leg actually showed signs of cellulitis this is not doing as well as I like to see it. Has not been using Tubigrip quite right. I think we need to try to get this infection under control before all else. He voiced understanding. With that being said I  do think that at this point we are going to mark where the erythema redness is visually and tactile we are going to monitor and make sure that this does not spread if it does he needs to go to the ER soon as possible for now I am going to send in a prescription for Levaquin for him which I think is gena be the best way to go and he is in agreement with that plan. 11-18-2022 upon evaluation today patient appears to be doing decently well currently in regard to his leg. With that being said I feel like the erythema is a little bit better we're not completely resolved yet but I definitely see signs of improvement compared to where we were. Fortunately I do not see any evidence of active infection systemically which is great news. 12-07-2022 upon evaluation today patient appears to be doing well currently in regard to his wound on the leg which one of them is healed the other seems to be improving significantly at this point. Fortunately I do not see any signs of active infection at this time. No fevers, chills, nausea, vomiting, or diarrhea. 12-20-2022 upon evaluation today patient appears to be doing well currently in regard to his wound. He has been tolerating the dressing changes without complication. With that being said I do see some signs of improvement especially in regard to the leg it looks excellent. In regard to the foot this is. A little bit better although it still not tremendously smaller. We have been discussing doing a cast but he is really not interested 12/30; patient arrives today without anything on the left leg from a  compression point of view. He says that the Tubigrip made the swelling in his left leg worse which I find unlikely and I told him so. He has a small wound on the left lateral lower leg in the setting of chronic venous insufficiency and secondary lymphedema. The other wound here is also worrisome the left fifth metatarsal plantar head. This had significant undermining from about 6-10 o'clock. I believe we ordered him a psychologist, forensic and it came to his house. He did not bring it in but said it was too small for his size 13 to 14 foot. He came back in and his surgical shoe. His wife has been changing the dressings at home 01-11-23 upon evaluation today patient appears to be doing pretty well in regard to his wounds I do not see any signs of significant infection which is good news and in general I do believe that were making some headway here towards closure which is excellent as well. No fevers, chills, nausea, vomiting, or diarrhea. He feels like the wrap did well for him. Objective Constitutional Well-nourished and well-hydrated in no acute distress. Vitals Time Taken: 8:00 AM, Height: 74 in, Weight: 546 lbs, BMI: 70.1, Temperature: 97.5 F, Pulse: 97 bpm, Respiratory Rate: 18 breaths/min, Blood Pressure: 180/109 mmHg. General Notes: Patient states he has not taken his BP meds yet. T aken manually and provider notified. Respiratory normal breathing without difficulty. Psychiatric this patient is able to make decisions and demonstrates good insight into disease process. Alert and Oriented x 3. pleasant and cooperative. General Notes: Upon inspection patient's wound bed actually showed signs of good granulation epithelization at this point. Fortunately I do not see any signs of [name] [name], [name] [name] ([id number]) [id number] Page 8 of 10 worsening overall and I believe that the patient is making headway here towards closure. Integumentary (Hair, Skin) Wound #1  status is Open.  Original cause of wound was Gradually Appeared. The date acquired was: 08/19/2022. The wound has been in treatment 10 weeks. The wound is located on the Left,Plantar Foot. The wound measures 1cm length x 1.7cm width x 0.2cm depth; 1.335cm^2 area and 0.267cm^3 volume. There is Fat Layer (Subcutaneous Tissue) exposed. There is a medium amount of serosanguineous drainage noted. There is large (67-100%) red granulation within the wound bed. There is a small (1-33%) amount of necrotic tissue within the wound bed including Adherent Slough. Wound #3 status is Open. Original cause of wound was Gradually Appeared. The date acquired was: 08/19/2022. The wound has been in treatment 10 weeks. The wound is located on the Left,Lateral Lower Leg. The wound measures 1.5cm length x 0.8cm width x 0.1cm depth; 0.942cm^2 area and 0.094cm^3 volume. There is Fat Layer (Subcutaneous Tissue) exposed. There is a medium amount of serosanguineous drainage noted. There is large (67-100%) red granulation within the wound bed. There is a small (1-33%) amount of necrotic tissue within the wound bed including Adherent Slough. 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 There was a Excisional Skin/Subcutaneous Tissue Debridement with a total area of 1.33 sq cm performed by Bethena Ferraris, PA-C. With the following instrument(s): Curette to remove Viable and Non-Viable tissue/material.  Material removed includes Callus, Subcutaneous Tissue, and Slough after achieving pain control using Lidocaine 4% T opical Solution. No specimens were taken. A time out was conducted at 08:28, prior to the start of the procedure. A Moderate amount of bleeding was controlled with Silver Nitrate. The procedure was tolerated well with a pain level of 0 throughout and a pain level of 0 following the procedure. Post Debridement Measurements: 1cm length x 1.7cm width x 0.2cm depth; 0.267cm^3 volume. Character of Wound/Ulcer Post Debridement is stable. Severity of Tissue Post Debridement is: Fat layer exposed. Post procedure Diagnosis Wound #1: Same as Pre-Procedure Pre-procedure diagnosis of Wound #1 is a Diabetic Wound/Ulcer of the Lower Extremity located on the Left,Plantar Foot . There was a Four Layer Compression Therapy Procedure by Claudene Blossom, RN. Post procedure Diagnosis Wound #1: Same as Pre-Procedure Wound #3 Pre-procedure diagnosis of Wound #3 is a Diabetic Wound/Ulcer of the Lower Extremity located on the Left,Lateral Lower Leg .Severity of Tissue Pre Debridement is: Fat layer exposed. There was a Excisional Skin/Subcutaneous Tissue Debridement with a total area of 0.94 sq cm performed by Bethena Ferraris, PA-C. With the following instrument(s): Curette to remove Viable and Non-Viable tissue/material. Material removed includes Subcutaneous Tissue and Slough and after achieving pain control using Lidocaine 4% Topical Solution. No specimens were taken. A time out was conducted at 08:28, prior to the start of the procedure. A Moderate amount of bleeding was controlled with Pressure. The procedure was tolerated well with a pain level of 0 throughout and a pain level of 0 following the procedure. Post Debridement Measurements: 1.5cm length x 0.8cm width x 0.1cm depth; 0.094cm^3 volume. Character of Wound/Ulcer Post Debridement is stable. Severity of Tissue Post Debridement is:  Fat layer exposed. Post  procedure Diagnosis Wound #3: Same as Pre-Procedure Pre-procedure diagnosis of Wound #3 is a Diabetic Wound/Ulcer of the Lower Extremity located on the Left,Lateral Lower Leg . There was a Four Layer Compression Therapy Procedure by Claudene Blossom, RN. Post procedure Diagnosis Wound #3: Same as Pre-Procedure Plan Follow-up Appointments: Return Appointment in 1 week. Bathing/ Shower/ Hygiene: May shower with wound dressing protected with water repellent cover or cast protector. No tub bath. Anesthetic (Use 'Patient Medications' Section for Anesthetic Order Entry): Lidocaine applied to wound bed Edema Control - Orders / Instructions: UrgoK2 40mmhg - Large Elevate, Exercise Daily and Avoid Standing for Long Periods of Time. Elevate leg(s) parallel to the floor when sitting. DO YOUR BEST to sleep in the bed at night. DO NOT sleep in your recliner. Long hours of sitting in a recliner leads to swelling of the legs and/or potential wounds on your backside. Off-Loading: Open toe surgical shoe with peg assist. - left foot [name] [name], [place] ([id number]) [id number] Page 9 of 10 Other: - dial soap original gold recommended Medications-Please add to medication list.: P.O. Antibiotics - continue and finish as prescribed WOUND #1: - Foot Wound Laterality: Plantar, Left Cleanser: Byram Ancillary Kit - 15 Day Supply (Generic) Every Other Day/30 Days Discharge Instructions: Use supplies as instructed; Kit contains: (15) Saline Bullets; (15) 3x3 Gauze; 15 pr Gloves Cleanser: Soap and Water Every Other Day/30 Days Discharge Instructions: Gently cleanse wound with antibacterial soap, rinse and pat dry prior to dressing wounds Cleanser: Wound Cleanser Every Other Day/30 Days Discharge Instructions: Wash your hands with soap and water. Remove old dressing, discard into plastic bag and place into trash. Cleanse the wound with Wound Cleanser prior to applying a clean dressing using  gauze sponges, not tissues or cotton balls. Do not scrub or use excessive force. Pat dry using gauze sponges, not tissue or cotton balls. Prim Dressing: Hydrofera Blue Ready Transfer Foam, 2.5x2.5 (in/in) Every Other Day/30 Days ary Discharge Instructions: Apply Hydrofera Blue Ready to wound bed as directed Secondary Dressing: ABD Pad 5x9 (in/in) Every Other Day/30 Days Discharge Instructions: Cover with ABD pad Com pression Wrap: Urgo K2, two layer compression system, large Every Other Day/30 Days WOUND #3: - Lower Leg Wound Laterality: Left, Lateral Cleanser: Byram Ancillary Kit - 15 Day Supply (Generic) Every Other Day/30 Days Discharge Instructions: Use supplies as instructed; Kit contains: (15) Saline Bullets; (15) 3x3 Gauze; 15 pr Gloves Cleanser: Soap and Water Every Other Day/30 Days Discharge Instructions: Gently cleanse wound with antibacterial soap, rinse and pat dry prior to dressing wounds Cleanser: Wound Cleanser Every Other Day/30 Days Discharge Instructions: Wash your hands with soap and water. Remove old dressing, discard into plastic bag and place into trash. Cleanse the wound with Wound Cleanser prior to applying a clean dressing using gauze sponges, not tissues or cotton balls. Do not scrub or use excessive force. Pat dry using gauze sponges, not tissue or cotton balls. Prim Dressing: Hydrofera Blue Ready Transfer Foam, 2.5x2.5 (in/in) Every Other Day/30 Days ary Discharge Instructions: Apply Hydrofera Blue Ready to wound bed as directed Secondary Dressing: ABD Pad 5x9 (in/in) Every Other Day/30 Days Discharge Instructions: Cover with ABD pad Com pression Wrap: Urgo K2, two layer compression system, large Every Other Day/30 Days 1. I am going to recommend that we have the patient going to continue to monitor for any signs of infection or worsening. I do believe that the Hydrofera Blue is doing a good job working to use this at  both locations. 2. I am also going to  recommend that we continue with Urgo K2 compression wrap. 3. I am also going to recommend that he should continue to offload is much as possible the more that he can do the better. He does not want to go on the cast we will see how things proceed. We will see patient back for reevaluation in 1 week here in the clinic. If anything worsens or changes patient will contact our office for additional recommendations. Electronic Signature(s) Signed: 01/11/2023 5:43:25 PM By: Bethena Ferraris PA-C Entered By: Bethena Ferraris on 01/11/2023 14:43:25 -------------------------------------------------------------------------------- SuperBill Details Patient Name: Date of Service: [name] [name], [place] 01/11/2023 Medical Record Number: [id number] Patient Account Number: [id number] Date of Birth/Sex: Treating RN: 1970/12/15 (30 [name].o. [name] Primary Care Provider: Lenon Na Other Clinician: Referring Provider: Treating Provider/Extender: Bethena Ferraris Lenon Na Devra in Treatment: 10 Diagnosis Coding ICD-10 Codes Code Description E11.621 Type 2 diabetes mellitus with foot ulcer L97.522 Non-pressure chronic ulcer of other part of left foot with fat layer exposed L97.822 Non-pressure chronic ulcer of other part of left lower leg with fat layer exposed I87.332 Chronic venous hypertension (idiopathic) with ulcer and inflammation of left lower extremity E11.622 Type 2 diabetes mellitus with other skin ulcer Luellen, Priscilla ([id number]) [id number] Page 10 of 10 I10 Essential (primary) hypertension Facility Procedures : CPT4 Code: [id number] Description: 11042 - DEB SUBQ TISSUE 20 SQ CM/< ICD-10 Diagnosis Description L97.522 Non-pressure chronic ulcer of other part of left foot with fat layer exposed L97.822 Non-pressure chronic ulcer of other part of left lower leg with fat layer expo Modifier: sed Quantity: 1 Physician Procedures : CPT4 Code Description Modifier  11042 11042 - WC PHYS SUBQ TISS 20 SQ CM ICD-10 Diagnosis Description L97.522 Non-pressure chronic ulcer of other part of left foot with fat layer exposed L97.822 Non-pressure chronic ulcer of other part of left lower leg  with fat layer exposed Quantity: 1 Electronic Signature(s) Signed: 01/11/2023 5:44:03 PM By: Bethena Ferraris PA-C Previous Signature: 01/11/2023 5:43:51 PM Version By: Bethena Ferraris PA-C Entered By: Bethena Ferraris on 01/11/2023 14:44:02

## 2023-01-13 NOTE — Progress Notes (Signed)
 [name] [name], [name] [name] ([id number]) [id number] Page 1 of 10 Visit Report for 01/11/2023 Arrival Information Details Patient Name: Date of Service: [name] [name], [name] [name] 01/11/2023 7:45 A M Medical Record Number: [id number] Patient Account Number: [id number] Date of Birth/Sex: Treating RN: 1970-01-09 (53 y.o. [name] [name] [name] [name] Primary Care [name] [name]: [name] [name] Other Clinician: Referring [name] [name]: Treating [name] [name]/Extender: [name] [name], [name] [name] [name] [name] [name] [name] [name] [name]: 10 Visit Information History Since Last Visit Added or deleted any medications: No Patient Arrived: Ambulatory Any new allergies or adverse reactions: No Arrival Time: 07:48 Had a fall or experienced change [name] No Accompanied By: self activities of daily living that may affect Transfer Assistance: None risk of falls: Patient Identification Verified: Yes Signs or symptoms of abuse/neglect since last visito No Secondary Verification Process Completed: Yes Hospitalized since last visit: No Patient Requires Transmission-Based Precautions: No Has Dressing [name] Place as Prescribed: Yes Patient Has Alerts: Yes Has Compression [name] Place as Prescribed: Yes Patient Alerts: type 2 diabetic Pain Present Now: No ABI 11/01/22 Lea Electronic Signature(s) Signed: 01/12/2023 5:32:36 PM By: [name] [name] Entered By: [name] [name] on 01/11/2023 07:49:00 -------------------------------------------------------------------------------- Compression Therapy Details Patient Name: Date of Service: [name] [name], [place] LV [name] 01/11/2023 7:45 A M Medical Record Number: [id number] Patient Account Number: [id number] Date of Birth/Sex: Treating RN: 12/22/1970 (53 y.o. [name] [name] [name] [name] Primary Care Odelle Kosier: [name] [name] Other Clinician: Referring [name] [name]: Treating Cleston Lautner/Extender: [name] [name]: 10 Compression Therapy Performed for Wound Assessment: Wound #1  Left,Plantar Foot Performed By: Clinician [name] Blossom, RN Compression Type: Four Layer Post Procedure Diagnosis Same as Pre-procedure Electronic Signature(s) Signed: 01/12/2023 5:32:36 PM By: [name] [name] Entered By: [name] [name] on 01/11/2023 08:46:23 [name] [name], [name] [name] ([id number]) [id number] Page 2 of 10 -------------------------------------------------------------------------------- Compression Therapy Details Patient Name: Date of Service: [name] [name], [place] LV [name] 01/11/2023 7:45 A M Medical Record Number: [id number] Patient Account Number: [id number] Date of Birth/Sex: Treating RN: [name] [name] 17, 1972 (53 y.o. [name] [name] [name] [name] Primary Care Shelbee Apgar: [name] [name] Other Clinician: Referring Julieta Rogalski: Treating Inioluwa Boulay/Extender: [name] [name]: 10 Compression Therapy Performed for Wound Assessment: Wound #3 Left,Lateral Lower Leg Performed By: Clinician [name] Blossom, RN Compression Type: Four Layer Post Procedure Diagnosis Same as Pre-procedure Electronic Signature(s) Signed: 01/12/2023 5:32:36 PM By: [name] [name] Entered By: [name] [name] on 01/11/2023 08:46:23 -------------------------------------------------------------------------------- Encounter Discharge Information Details Patient Name: Date of Service: [address], [place] LV [name] 01/11/2023 7:45 A M Medical Record Number: [id number] Patient Account Number: [id number] Date of Birth/Sex: Treating RN: April 24, 1970 (53 y.o. [name] [name] [name] [name] Primary Care Minahil Quinlivan: [name] [name] Other Clinician: Referring Mekiyah Gladwell: Treating Maleik Vanderzee/Extender: [name] [name]: 10 Encounter Discharge Information Items Post Procedure Vitals Discharge Condition: Stable Temperature (F): 97.5 Ambulatory Status: Ambulatory Pulse (bpm): 97 Discharge Destination: Home Respiratory Rate (breaths/min): 18 Transportation: Private Auto Blood  Pressure (mmHg): 180/109 Accompanied By: self Schedule Follow-up Appointment: Yes Clinical Summary of Care: Electronic Signature(s) Signed: 01/12/2023 5:32:36 PM By: [name] [name] Entered By: [name] [name] on 01/11/2023 08:48:18 [name] [name], [name] [name] ([id number]) [id number] Page 3 of 10 -------------------------------------------------------------------------------- Lower Extremity Assessment Details Patient Name: Date of Service: [name] [name], [place] LV [name] 01/11/2023 7:45 A M Medical Record Number: [id number] Patient Account Number: [id number] Date of Birth/Sex: Treating RN: 1970/08/10 (53 y.o. [name] [name] [name] [name] Primary Care Cyprian Gongaware: [name] [name] Other Clinician: Referring Hazelgrace Bonham: Treating  Reynard Christoffersen/Extender: [name] [name] [name] Layman Weeks [name] [name]: 10 Edema Assessment Assessed: [Left: Yes] [Right: No] Edema: [Left: N] [Right: o] Calf Left: Right: Point of Measurement: 39 cm From Medial Instep 53.5 cm Ankle Left: Right: Point of Measurement: 12 cm From Medial Instep 28.3 cm Knee To Floor Left: Right: From Medial Instep 41 cm Vascular Assessment Pulses: Dorsalis Pedis Palpable: [Left:Yes] Extremity colors, hair growth, and conditions: Extremity Color: [Left:Hyperpigmented] Hair Growth on Extremity: [Left:No] Temperature of Extremity: [Left:Warm] Capillary Refill: [Left:< 3 seconds] Dependent Rubor: [Left:No] Blanched when Elevated: [Left:No No] Toe Nail Assessment Left: Right: Thick: Yes Discolored: Yes Deformed: Yes Improper Length and Hygiene: Yes Electronic Signature(s) Signed: 01/12/2023 5:32:36 PM By: [name] [name] Entered By: [name] [name] on 01/11/2023 08:17:37 [name] [name], [name] [name] ([id number]) [id number] Page 4 of 10 -------------------------------------------------------------------------------- Multi Wound Chart Details Patient Name: Date of Service: [name] [name], [place] LV [name] 01/11/2023  7:45 A M Medical Record Number: [id number] Patient Account Number: [id number] Date of Birth/Sex: Treating RN: August 04, 1970 (53 y.o. [name] [name] [name] [name] Primary Care Hanin Decook: [name] Layman Other Clinician: Referring Avinash Maltos: Treating Charmion Hapke/Extender: [name] [name] [name] Layman [name] [name] [name] [name]: 10 Vital Signs Height([name]): 74 Pulse(bpm): 97 Weight(lbs): 546 Blood Pressure(mmHg): 180/109 Body Mass Index(BMI): 70.1 Temperature(F): 97.5 Respiratory Rate(breaths/min): 18 [1:Photos:] [N/A:N/A] Left, Plantar Foot Left, Lateral Lower Leg N/A Wound Location: Gradually Appeared Gradually Appeared N/A Wounding Event: Diabetic Wound/Ulcer of the Lower Diabetic Wound/Ulcer of the Lower N/A Primary Etiology: Extremity Extremity Hypertension, Type II Diabetes, Gout Hypertension, Type II Diabetes, Gout N/A Comorbid History: 08/19/2022 08/19/2022 N/A Date Acquired: 10 10 N/A Weeks of [name]: Open Open N/A Wound Status: No No N/A Wound Recurrence: 1x1.7x0.2 1.5x0.8x0.1 N/A Measurements L x W x D (cm) 1.335 0.942 N/A A (cm) : rea 0.267 0.094 N/A Volume (cm) : -77.10% 33.40% N/A % Reduction [name] A rea: -18.10% 66.80% N/A % Reduction [name] Volume: Grade 1 Grade 1 N/A Classification: Medium Medium N/A Exudate A mount: Serosanguineous Serosanguineous N/A Exudate Type: red, brown red, brown N/A Exudate Color: Large (67-100%) Large (67-100%) N/A Granulation A mount: Red Red N/A Granulation Quality: Small (1-33%) Small (1-33%) N/A Necrotic A mount: Fat Layer (Subcutaneous Tissue): Yes Fat Layer (Subcutaneous Tissue): Yes N/A Exposed Structures: None None N/A Epithelialization: [name] Notes Electronic Signature(s) Signed: 01/12/2023 5:32:36 PM By: [name] [name] Entered By: [name] [name] on 01/11/2023 08:28:01 [name] [name], [name] [name] ([id number]) [id number] Page 5 of  10 -------------------------------------------------------------------------------- Multi-Disciplinary Care Plan Details Patient Name: Date of Service: [name] [name], [place] LV [name] 01/11/2023 7:45 A M Medical Record Number: [id number] Patient Account Number: [id number] Date of Birth/Sex: Treating RN: 12/20/1970 (53 y.o. [name] [name] [name] [name] Primary Care Jeffre Enriques: [name] Layman Other Clinician: Referring Pasquale Matters: Treating Jahlil Ziller/Extender: [name] [name] [name] Layman [name] [name] [name] [name]: 10 Active Inactive Wound/Skin Impairment Nursing Diagnoses: Impaired tissue integrity Knowledge deficit related to ulceration/compromised skin integrity Goals: Ulcer/skin breakdown will have a volume reduction of 30% by week 4 Date Initiated: 11/01/2022 Date Inactivated: 12/07/2022 Target Resolution Date: 11/29/2022 Goal Status: Unmet Unmet Reason: comorbidities Ulcer/skin breakdown will have a volume reduction of 50% by week 8 Date Initiated: 11/01/2022 Date Inactivated: 01/03/2023 Target Resolution Date: 12/27/2022 Goal Status: Met Ulcer/skin breakdown will have a volume reduction of 80% by week 12 Date Initiated: 11/01/2022 Target Resolution Date: 01/24/2023 Goal Status: Active Ulcer/skin breakdown will heal within 14 weeks Date Initiated: 11/01/2022 Target Resolution Date: 02/07/2023 Goal Status: Active Interventions: Assess patient/caregiver ability to obtain necessary supplies Assess patient/caregiver ability to perform ulcer/skin  care regimen upon admission and as needed Assess ulceration(s) every visit Provide education on ulcer and skin care Notes: Electronic Signature(s) Signed: 01/12/2023 5:32:36 PM By: [name] [name] Entered By: [name] [name] on 01/11/2023 08:47:03 -------------------------------------------------------------------------------- Pain Assessment Details Patient Name: Date of Service: [name] [name], [place] LV [name] 01/11/2023 7:45 A M Medical Record Number:  [id number] Patient Account Number: [id number] Date of Birth/Sex: Treating RN: 1970/11/05 (53 y.o. Gurdeep, K[place], [place] ([id number]) [id number] Page 6 of 10 Primary Care Nicolas Banh: [name] [name] Other Clinician: Referring Lavonta Tillis: Treating Kyrel Leighton/Extender: [name] [name]: 10 Active Problems Location of Pain Severity and Description of Pain Patient Has Paino No Site Locations Pain Management and Medication Current Pain Management: Electronic Signature(s) Signed: 01/12/2023 5:32:36 PM By: [name] [name] Entered By: [name] [name] on 01/11/2023 08:14:12 -------------------------------------------------------------------------------- Patient/Caregiver Education Details Patient Name: Date of Service: [place], [place] LV [name] 1/7/2025andnbsp7:45 A M Medical Record Number: [id number] Patient Account Number: [id number] Date of Birth/Gender: Treating RN: April 23, 1970 (53 y.o. [name] [name] [name] [name] Primary Care Physician: [name] [name] Other Clinician: Referring Physician: Treating Physician/Extender: [name] [name]: 10 Education Assessment Education Provided To: Patient Education Topics Provided Wound Debridement: Handouts: Wound Debridement Methods: Explain/Verbal Responses: State content correctly Wound/Skin Impairment: Handouts: Caring for Your Ulcer Methods: Explain/Verbal Responses: State content correctly [name] [name], [name] [name] ([id number]) [id number] Page 7 of 10 Electronic Signature(s) Signed: 01/12/2023 5:32:36 PM By: [name] [name] Entered By: [name] [name] on 01/11/2023 08:47:22 -------------------------------------------------------------------------------- Wound Assessment Details Patient Name: Date of Service: [name] [name], [place] LV [name] 01/11/2023 7:45 A M Medical Record Number: [id number] Patient Account Number: [id number] Date  of Birth/Sex: Treating RN: 22-Jun-1970 (53 y.o. [name] [name] [name] [name] Primary Care Nailyn Dearinger: [name] [name] Other Clinician: Referring Kadarius Cuffe: Treating Cheskel Silverio/Extender: [name] [name]: 10 Wound Status Wound Number: 1 Primary Etiology: Diabetic Wound/Ulcer of the Lower Extremity Wound Location: Left, Plantar Foot Wound Status: Open Wounding Event: Gradually Appeared Comorbid History: Hypertension, Type II Diabetes, Gout Date Acquired: 08/19/2022 Weeks Of [name]: 10 Clustered Wound: No Photos Wound Measurements Length: (cm) 1 Width: (cm) 1.7 Depth: (cm) 0.2 Area: (cm) 1.335 Volume: (cm) 0.267 % Reduction [name] Area: -77.1% % Reduction [name] Volume: -18.1% Epithelialization: None Wound Description Classification: Grade 1 Exudate Amount: Medium Exudate Type: Serosanguineous Exudate Color: red, brown Foul Odor After Cleansing: No Slough/Fibrino Yes Wound Bed Granulation Amount: Large (67-100%) Exposed Structure Granulation Quality: Red Fat Layer (Subcutaneous Tissue) Exposed: Yes Necrotic Amount: Small (1-33%) Necrotic Quality: Adherent Slough [name] Notes Wound #1 (Foot) Wound Laterality: Plantar, Left [name] [name], [name] [name] ([id number]) [id number] Page 8 of 10 Cleanser Byram Ancillary Kit - 15 Day Supply Discharge Instruction: Use supplies as instructed; Kit contains: (15) Saline Bullets; (15) 3x3 Gauze; 15 pr Gloves Soap and Water Discharge Instruction: Gently cleanse wound with antibacterial soap, rinse and pat dry prior to dressing wounds Wound Cleanser Discharge Instruction: Wash your hands with soap and water. Remove old dressing, discard into plastic bag and place into trash. Cleanse the wound with Wound Cleanser prior to applying a clean dressing using gauze sponges, not tissues or cotton balls. Do not scrub or use excessive force. Pat dry using gauze sponges, not tissue or cotton balls. Peri-Wound  Care Topical Primary Dressing Hydrofera Blue Ready Transfer Foam, 2.5x2.5 ([name]/[name]) Discharge Instruction: Apply Hydrofera Blue Ready to wound bed as directed Secondary Dressing ABD Pad 5x9 ([name]/[name]) Discharge Instruction: Cover with ABD  pad Secured With Compression Wrap Urgo K2, two layer compression system, large Compression Stockings Add-Ons Electronic Signature(s) Signed: 01/12/2023 5:32:36 PM By: [name] [name] Entered By: [name] [name] on 01/11/2023 08:15:27 -------------------------------------------------------------------------------- Wound Assessment Details Patient Name: Date of Service: [name] [name], [place] LV [name] 01/11/2023 7:45 A M Medical Record Number: [id number] Patient Account Number: [id number] Date of Birth/Sex: Treating RN: Mar 27, 1970 (53 y.o. [name] [name] [name] [name] Primary Care Keagen Heinlen: [name] [name] Other Clinician: Referring Frankye Schwegel: Treating Dyron Kawano/Extender: [name] [name]: 10 Wound Status Wound Number: 3 Primary Etiology: Diabetic Wound/Ulcer of the Lower Extremity Wound Location: Left, Lateral Lower Leg Wound Status: Open Wounding Event: Gradually Appeared Comorbid History: Hypertension, Type II Diabetes, Gout Date Acquired: 08/19/2022 Weeks Of [name]: 10 Clustered Wound: No Photos [name] [name], [name] [name] ([id number]) [id number] Page 9 of 10 Wound Measurements Length: (cm) 1.5 Width: (cm) 0.8 Depth: (cm) 0.1 Area: (cm) 0.942 Volume: (cm) 0.094 % Reduction [name] Area: 33.4% % Reduction [name] Volume: 66.8% Epithelialization: None Wound Description Classification: Grade 1 Exudate Amount: Medium Exudate Type: Serosanguineous Exudate Color: red, brown Foul Odor After Cleansing: No Slough/Fibrino Yes Wound Bed Granulation Amount: Large (67-100%) Exposed Structure Granulation Quality: Red Fat Layer (Subcutaneous Tissue) Exposed: Yes Necrotic Amount: Small (1-33%) Necrotic Quality: Adherent  Slough [name] Notes Wound #3 (Lower Leg) Wound Laterality: Left, Lateral Cleanser Byram Ancillary Kit - 15 Day Supply Discharge Instruction: Use supplies as instructed; Kit contains: (15) Saline Bullets; (15) 3x3 Gauze; 15 pr Gloves Soap and Water Discharge Instruction: Gently cleanse wound with antibacterial soap, rinse and pat dry prior to dressing wounds Wound Cleanser Discharge Instruction: Wash your hands with soap and water. Remove old dressing, discard into plastic bag and place into trash. Cleanse the wound with Wound Cleanser prior to applying a clean dressing using gauze sponges, not tissues or cotton balls. Do not scrub or use excessive force. Pat dry using gauze sponges, not tissue or cotton balls. Peri-Wound Care Topical Primary Dressing Hydrofera Blue Ready Transfer Foam, 2.5x2.5 ([name]/[name]) Discharge Instruction: Apply Hydrofera Blue Ready to wound bed as directed Secondary Dressing ABD Pad 5x9 ([name]/[name]) Discharge Instruction: Cover with ABD pad Secured With Compression Wrap Urgo K2, two layer compression system, large Compression Stockings Add-Ons Electronic Signature(s) Signed: 01/12/2023 5:32:36 PM By: [name] [name] Entered By: [name] [name] on 01/11/2023 08:15:49 [name] [name], [name] [name] ([id number]) [id number] Page 10 of 10 -------------------------------------------------------------------------------- Vitals Details Patient Name: Date of Service: [name] [name], [place] LV [name] 01/11/2023 7:45 A M Medical Record Number: [id number] Patient Account Number: [id number] Date of Birth/Sex: Treating RN: 10-12-1970 (53 y.o. [name] [name] [name] [name] Primary Care Grey Rakestraw: [name] [name] Other Clinician: Referring Karlin Heilman: Treating Trever Streater/Extender: RO BSO N, [name] [name] [name] [name] [name] [name] [name] [name]: 10 Vital Signs Time Taken: 08:00 Temperature (F): 97.5 Height ([name]): 74 Pulse (bpm): 97 Weight (lbs): 546 Respiratory Rate (breaths/min):  18 Body Mass Index (BMI): 70.1 Blood Pressure (mmHg): 180/109 Reference Range: 80 - 120 mg / dl Notes Patient states he has not taken his BP meds yet. Taken manually and Janina Trafton notified. Electronic Signature(s) Signed: 01/12/2023 5:32:36 PM By: [name] [name] Entered By: [name] [name] on 01/11/2023 08:14:04

## 2023-01-18 ENCOUNTER — Encounter: Payer: 59 | Admitting: Physician Assistant

## 2023-01-18 DIAGNOSIS — I87332 Chronic venous hypertension (idiopathic) with ulcer and inflammation of left lower extremity: Secondary | ICD-10-CM | POA: Diagnosis not present

## 2023-01-19 NOTE — Progress Notes (Signed)
 [name] [name], [name] [name] ([id number]) [id number] Page 1 of 10 Visit Report for 01/18/2023 Arrival Information Details Patient Name: Date of Service: [name] [name], [name] [name] [place] 01/18/2023 2:15 PM Medical Record Number: [id number] Patient Account Number: [id number] Date of Birth/Sex: Treating RN: August 31, 1970 (53 [name] [name] [name] Primary Care [name] [name]: [name] [name] Other Clinician: Referring [name] [name]: Treating [name] [name]/Extender: [name] [name] in [name]: 11 Visit Information History Since Last Visit Added or deleted any medications: No Patient Arrived: Ambulatory Any new allergies or adverse reactions: No Arrival Time: 14:08 Had a fall or experienced change in No Accompanied By: self activities of daily living that may affect Transfer Assistance: None risk of falls: Patient Identification Verified: Yes Hospitalized since last visit: No Secondary Verification Process Completed: Yes Has Dressing in Place as Prescribed: Yes Patient Requires Transmission-Based Precautions: No Pain Present Now: No Patient Has Alerts: Yes Patient Alerts: type 2 diabetic ABI 11/01/22 [place] Electronic Signature(s) Signed: 01/18/2023 4:00:17 PM By: [name] [name] Entered By: [name] [name] on 01/18/2023 14:17:10 -------------------------------------------------------------------------------- Clinic Level of Care Assessment Details Patient Name: Date of Service: [name] [name], [name] 01/18/2023 2:15 PM Medical Record Number: [id number] Patient Account Number: [id number] Date of Birth/Sex: Treating RN: 25-Oct-1970 (53 [name] [name] [name] Primary Care [name] [name]: [name] [name] Other Clinician: Referring [name] [name]: Treating [name] [name]/Extender: [name] [name] in [name]: 11 Clinic Level of Care Assessment Items TOOL 1 Quantity Score []  - 0 Use when EandM and Procedure is performed on INITIAL visit ASSESSMENTS - Nursing Assessment /  Reassessment []  - 0 General Physical Exam (combine w/ comprehensive assessment (listed just below) when performed on new pt. evals) []  - 0 Comprehensive Assessment (HX, ROS, Risk Assessments, Wounds Hx, etc.) ASSESSMENTS - Wound and Skin Assessment / Reassessment []  - 0 Dermatologic / Skin Assessment (not related to wound area) [name] [name], [name] [name] ([id number]) [id number] Page 2 of 10 ASSESSMENTS - Ostomy and/or Continence Assessment and Care []  - 0 Incontinence Assessment and Management []  - 0 Ostomy Care Assessment and Management (repouching, etc.) PROCESS - Coordination of Care []  - 0 Simple Patient / Family Education for ongoing care []  - 0 Complex (extensive) Patient / Family Education for ongoing care []  - 0 Staff obtains Chiropractor, Records, T Results / Process Orders est []  - 0 Staff telephones HHA, Nursing Homes / Clarify orders / etc []  - 0 Routine Transfer to another Facility (non-emergent condition) []  - 0 Routine Hospital Admission (non-emergent condition) []  - 0 New Admissions / Manufacturing Engineer / Ordering NPWT Apligraf, etc. , []  - 0 Emergency Hospital Admission (emergent condition) PROCESS - Special Needs []  - 0 Pediatric / Minor Patient Management []  - 0 Isolation Patient Management []  - 0 Hearing / Language / Visual special needs []  - 0 Assessment of Community assistance (transportation, D/C planning, etc.) []  - 0 Additional assistance / Altered mentation []  - 0 Support Surface(s) Assessment (bed, cushion, seat, etc.) INTERVENTIONS - Miscellaneous []  - 0 External ear exam []  - 0 Patient Transfer (multiple staff / Nurse, Adult / Similar devices) []  - 0 Simple Staple / Suture removal (25 or less) []  - 0 Complex Staple / Suture removal (26 or more) []  - 0 Hypo/Hyperglycemic Management (do not check if billed separately) []  - 0 Ankle / Brachial Index (ABI) - do not check if billed separately Has the patient been seen  at the hospital within the last three years: Yes Total Score: 0 Level Of Care: ____ Electronic Signature(s) Signed: 01/18/2023 4:00:17 PM By: [name],  Caitlin Entered By: [name] [name] on 01/18/2023 15:56:28 -------------------------------------------------------------------------------- Encounter Discharge Information Details Patient Name: Date of Service: [name] [name] [name] 01/18/2023 2:15 PM Medical Record Number: [id number] Patient Account Number: [id number] Date of Birth/Sex: Treating RN: [birth year] (53 [name] [name] [name] Primary Care Hibba Schram: [name] [name] Other Clinician: Referring [name] [name]: Treating [name] [name]/Extender: [name] [name] in [name]: 11 Encounter Discharge Information Items Post Procedure Vitals [place], [name] [name] ([id number]) [id number] Page 3 of 10 Discharge Condition: Stable Temperature (F): 97.5 Ambulatory Status: Ambulatory Pulse (bpm): 86 Discharge Destination: Home Respiratory Rate (breaths/min): 18 Transportation: Private Auto Blood Pressure (mmHg): 195/120 Accompanied By: self Schedule Follow-up Appointment: Yes Clinical Summary of Care: Notes Andre aware of increased BP, advised to monitor, pt asymptomatic currently Electronic Signature(s) Signed: 01/18/2023 3:57:56 PM By: [name] [name] Entered By: [name] [name] on 01/18/2023 15:57:56 -------------------------------------------------------------------------------- Lower Extremity Assessment Details Patient Name: Date of Service: [name] [name],  [place] 01/18/2023 2:15 PM Medical Record Number: [id number] Patient Account Number: [id number] Date of Birth/Sex: Treating RN: June 24, 1970 (53 [name] [name] [name] Primary Care Khrystal Jeanmarie: [name] [name] Other Clinician: Referring Angeles Paolucci: Treating Jaylee Freeze/Extender: [name] [name] in [name]: 11 Edema Assessment Assessed: [Left: No] [Right: No] Edema: [Left: Ye]  [Right: s] Calf Left: Right: Point of Measurement: 39 cm From Medial Instep 50 cm Ankle Left: Right: Point of Measurement: 12 cm From Medial Instep 289 cm Vascular Assessment Pulses: Dorsalis Pedis Palpable: [Left:Yes] Extremity colors, hair growth, and conditions: Extremity Color: [Left:Normal] Hair Growth on Extremity: [Left:Yes] Temperature of Extremity: [Left:Warm < 3 seconds] Toe Nail Assessment Left: Right: Thick: Yes Discolored: Yes Deformed: Yes Improper Length and Hygiene: Yes Electronic Signature(s) Signed: 01/18/2023 4:00:17 PM By: [name] [name] Entered By: [name] [name] on 01/18/2023 14:32:13 [name] [name], [name] [name] ([id number]) [id number] Page 4 of 10 -------------------------------------------------------------------------------- Multi Wound Chart Details Patient Name: Date of Service: [name] [name],  [place] 01/18/2023 2:15 PM Medical Record Number: [id number] Patient Account Number: [id number] Date of Birth/Sex: Treating RN: January 17, 1970 (70 [name] [name] [name] Primary Care Hitesh Fouche: [name] [name] Other Clinician: Referring Amalya Salmons: Treating Marajade Lei/Extender: [name] [name] in [name]: 11 Vital Signs Height(in): 74 Pulse(bpm): 86 Weight(lbs): 546 Blood Pressure(mmHg): 204/119 Body Mass Index(BMI): 70.1 Temperature(F): 97.5 Respiratory Rate(breaths/min): 18 [1:Photos:] [N/A:N/A] Left, Plantar Foot Left, Lateral Lower Leg N/A Wound Location: Gradually Appeared Gradually Appeared N/A Wounding Event: Diabetic Wound/Ulcer of the Lower Diabetic Wound/Ulcer of the Lower N/A Primary Etiology: Extremity Extremity Hypertension, Type II Diabetes, Gout Hypertension, Type II Diabetes, Gout N/A Comorbid History: 08/19/2022 08/19/2022 N/A Date Acquired: 11 11 N/A Weeks of [name]: Open Open N/A Wound Status: No No N/A Wound Recurrence: 1.2x1.7x0.4 1.9x0.5x0.1 N/A Measurements L x W x D (cm) 1.602 0.746  N/A A (cm) : rea 0.641 0.075 N/A Volume (cm) : -112.50% 47.20% N/A % Reduction in A rea: -183.60% 73.50% N/A % Reduction in Volume: Grade 1 Grade 1 N/A Classification: Medium Medium N/A Exudate A mount: Serosanguineous Serosanguineous N/A Exudate Type: red, brown red, brown N/A Exudate Color: Large (67-100%) Large (67-100%) N/A Granulation A mount: Red Red N/A Granulation Quality: None Present (0%) None Present (0%) N/A Necrotic A mount: Fat Layer (Subcutaneous Tissue): Yes Fat Layer (Subcutaneous Tissue): Yes N/A Exposed Structures: None None N/A Epithelialization: [name] Notes Electronic Signature(s) Signed: 01/18/2023 4:00:17 PM By: [name] [name] Entered By: [name] [name] on 01/18/2023 14:48:26 [name] [name], [name] [name] ([id number]) [id number] Page 5 of 10 -------------------------------------------------------------------------------- Multi-Disciplinary Care Plan Details Patient Name: Date of Service: [name] [name],  [name] [name] [place] 01/18/2023 2:15 PM Medical Record Number: [id number] Patient Account Number: [id number] Date of Birth/Sex: Treating RN: 1970-10-01 (30 [name] [name] [name] Primary Care Cicily Bonano: [name] [name] Other Clinician: Referring Kea Callan: Treating Kehlani Vancamp/Extender: [name] [name] in [name]: 11 Active Inactive Wound/Skin Impairment Nursing Diagnoses: Impaired tissue integrity Knowledge deficit related to ulceration/compromised skin integrity Goals: Ulcer/skin breakdown will have a volume reduction of 30% by week 4 Date Initiated: 11/01/2022 Date Inactivated: 12/07/2022 Target Resolution Date: 11/29/2022 Goal Status: Unmet Unmet Reason: comorbidities Ulcer/skin breakdown will have a volume reduction of 50% by week 8 Date Initiated: 11/01/2022 Date Inactivated: 01/03/2023 Target Resolution Date: 12/27/2022 Goal Status: Met Ulcer/skin breakdown will have a volume reduction of 80% by week  12 Date Initiated: 11/01/2022 Target Resolution Date: 01/24/2023 Goal Status: Active Ulcer/skin breakdown will heal within 14 weeks Date Initiated: 11/01/2022 Target Resolution Date: 02/07/2023 Goal Status: Active Interventions: Assess patient/caregiver ability to obtain necessary supplies Assess patient/caregiver ability to perform ulcer/skin care regimen upon admission and as needed Assess ulceration(s) every visit Provide education on ulcer and skin care Notes: Electronic Signature(s) Signed: 01/18/2023 3:56:40 PM By: [name] [name] Entered By: [name] [name] on 01/18/2023 15:56:40 -------------------------------------------------------------------------------- Pain Assessment Details Patient Name: Date of Service: [name] [name], [name] [name] [place] 01/18/2023 2:15 PM Medical Record Number: [id number] Patient Account Number: [id number] Date of Birth/Sex: Treating RN: 1970-07-24 (53 [name] [name] [name] [place], [place] ([id number]) [id number] Page 6 of 10 Primary Care Harvey Matlack: [name] [name] Other Clinician: Referring Addis Tuohy: Treating Jadalyn Oliveri/Extender: [name] [name] in [name]: 11 Active Problems Location of Pain Severity and Description of Pain Patient Has Paino No Site Locations Pain Management and Medication Current Pain Management: Electronic Signature(s) Signed: 01/18/2023 4:00:17 PM By: [name] [name] Entered By: [name] [name] on 01/18/2023 14:17:16 -------------------------------------------------------------------------------- Patient/Caregiver Education Details Patient Name: Date of Service: [name] LLO GUSTAVE [name], [name] [name] [place] 1/14/2025andnbsp2:15 PM Medical Record Number: [id number] Patient Account Number: [id number] Date of Birth/Gender: Treating RN: [birth year] (66 [name] [name] [name] Primary Care Physician: [name] [name] Other Clinician: Referring Physician: Treating Physician/Extender: [name] [name] in [name]: 11 Education Assessment Education Provided To: Patient Education Topics Provided Wound/Skin Impairment: Handouts: Caring for Your Ulcer Methods: Explain/Verbal Responses: State content correctly Electronic Signature(s) Signed: 01/18/2023 4:00:17 PM By: [name] [name] [name] [name], [name] [name] ([id number]) [id number] Page 7 of 10 Entered By: [name] [name] on 01/18/2023 15:56:50 -------------------------------------------------------------------------------- Wound Assessment Details Patient Name: Date of Service: [name] [name], [place] 01/18/2023 2:15 PM Medical Record Number: [id number] Patient Account Number: [id number] Date of Birth/Sex: Treating RN: [birth year] (54 [name] [name] [name] Primary Care Yazmen Briones: [name] [name] Other Clinician: Referring Quaran Kedzierski: Treating Briante Loveall/Extender: [name] [name] in [name]: 11 Wound Status Wound Number: 1 Primary Etiology: Diabetic Wound/Ulcer of the Lower Extremity Wound Location: Left, Plantar Foot Wound Status: Open Wounding Event: Gradually Appeared Comorbid History: Hypertension, Type II Diabetes, Gout Date Acquired: 08/19/2022 Weeks Of [name]: 11 Clustered Wound: No Photos Wound Measurements Length: (cm) 1.2 Width: (cm) 1.7 Depth: (cm) 0.4 Area: (cm) 1.602 Volume: (cm) 0.641 % Reduction in Area: -112.5% % Reduction in Volume: -183.6% Epithelialization: None Tunneling: No Undermining: No Wound Description Classification: Grade 1 Exudate Amount: Medium Exudate Type: Serosanguineous Exudate Color: red, brown Foul Odor After Cleansing: No Slough/Fibrino Yes Wound Bed Granulation Amount: Large (67-100%) Exposed Structure Granulation Quality: Red Fat Layer (Subcutaneous Tissue) Exposed: Yes Necrotic Amount: None Present (0%) [name] Notes Wound #1 (Foot) Wound Laterality: Plantar, Left Cleanser Byram Ancillary Kit -  15 Day  Supply Discharge Instruction: Use supplies as instructed; Kit contains: (15) Saline Bullets; (15) 3x3 Gauze; 15 pr Gloves Soap and Water Discharge Instruction: Gently cleanse wound with antibacterial soap, rinse and pat dry prior to dressing wounds [name] [name], [name] [name] ([id number]) [id number] Page 8 of 10 Wound Cleanser Discharge Instruction: Wash your hands with soap and water. Remove old dressing, discard into plastic bag and place into trash. Cleanse the wound with Wound Cleanser prior to applying a clean dressing using gauze sponges, not tissues or cotton balls. Do not scrub or use excessive force. Pat dry using gauze sponges, not tissue or cotton balls. Peri-Wound Care Topical Primary Dressing Hydrofera Blue Ready Transfer Foam, 2.5x2.5 (in/in) Discharge Instruction: Apply Hydrofera Blue Ready to wound bed as directed Secondary Dressing ABD Pad 5x9 (in/in) Discharge Instruction: Cover with ABD pad Secured With Compression Wrap Urgo K2, two layer compression system, large Compression Stockings Add-Ons Electronic Signature(s) Signed: 01/18/2023 4:00:17 PM By: [name] [name] Entered By: [name] [name] on 01/18/2023 14:31:00 -------------------------------------------------------------------------------- Wound Assessment Details Patient Name: Date of Service: [name] [name], [name] [name] [place] 01/18/2023 2:15 PM Medical Record Number: [id number] Patient Account Number: [id number] Date of Birth/Sex: Treating RN: [birth year] (80 [name] [name] [name] Primary Care Wesleigh Markovic: [name] [name] Other Clinician: Referring Treydon Henricks: Treating Pami Wool/Extender: [name] [name] in [name]: 11 Wound Status Wound Number: 3 Primary Etiology: Diabetic Wound/Ulcer of the Lower Extremity Wound Location: Left, Lateral Lower Leg Wound Status: Open Wounding Event: Gradually Appeared Comorbid History: Hypertension, Type II Diabetes, Gout Date Acquired:  08/19/2022 Weeks Of [name]: 11 Clustered Wound: No Photos Wound Measurements [name] [name], [name] [name] ([id number]) Length: (cm) 1.9 Width: (cm) 0.5 Depth: (cm) 0.1 Area: (cm) 0.746 Volume: (cm) 0.075 [id number] Page 9 of 10 % Reduction in Area: 47.2% % Reduction in Volume: 73.5% Epithelialization: None Tunneling: No Undermining: No Wound Description Classification: Grade 1 Exudate Amount: Medium Exudate Type: Serosanguineous Exudate Color: red, brown Foul Odor After Cleansing: No Slough/Fibrino Yes Wound Bed Granulation Amount: Large (67-100%) Exposed Structure Granulation Quality: Red Fat Layer (Subcutaneous Tissue) Exposed: Yes Necrotic Amount: None Present (0%) [name] Notes Wound #3 (Lower Leg) Wound Laterality: Left, Lateral Cleanser Byram Ancillary Kit - 15 Day Supply Discharge Instruction: Use supplies as instructed; Kit contains: (15) Saline Bullets; (15) 3x3 Gauze; 15 pr Gloves Soap and Water Discharge Instruction: Gently cleanse wound with antibacterial soap, rinse and pat dry prior to dressing wounds Wound Cleanser Discharge Instruction: Wash your hands with soap and water. Remove old dressing, discard into plastic bag and place into trash. Cleanse the wound with Wound Cleanser prior to applying a clean dressing using gauze sponges, not tissues or cotton balls. Do not scrub or use excessive force. Pat dry using gauze sponges, not tissue or cotton balls. Peri-Wound Care Topical Primary Dressing Promogran Matrix 4.34 (in) Discharge Instruction: Moisten w/normal saline or sterile water; Cover wound as directed. Do not remove from wound bed. Xeroform-HBD 2x2 (in/in) Discharge Instruction: Apply Xeroform-HBD 2x2 (in/in) as directed Secondary Dressing ABD Pad 5x9 (in/in) Discharge Instruction: Cover with ABD pad Secured With Compression Wrap Urgo K2, two layer compression system, large Compression Stockings Add-Ons Electronic  Signature(s) Signed: 01/18/2023 4:00:17 PM By: [name] [name] Entered By: [name] [name] on 01/18/2023 14:31:32 -------------------------------------------------------------------------------- Vitals Details Patient Name: Date of Service: [name] [name], [name] [name] [place] 01/18/2023 2:15 PM [name] [name], [name] [name] ([id number]) [id number] Page 10 of 10 Medical Record Number: [id number] Patient Account Number: [id number] Date of Birth/Sex: Treating RN: [birth year] (56 [name] [name] [name] Primary Care  Samarah Hogle: [name] [name] Other Clinician: Referring Jay Kempe: Treating Genita Nilsson/Extender: [name] [name] in [name]: 11 Vital Signs Time Taken: 14:17 Temperature (F): 97.5 Height (in): 74 Pulse (bpm): 86 Weight (lbs): 546 Respiratory Rate (breaths/min): 18 Body Mass Index (BMI): 70.1 Blood Pressure (mmHg): 204/119 Reference Range: 80 - 120 mg / dl Notes pt states feels fine/asymptomatic Electronic Signature(s) Signed: 01/18/2023 4:00:17 PM By: [name] [name] Entered By: [name] [name] on 01/18/2023 14:18:07

## 2023-01-19 NOTE — Progress Notes (Addendum)
 [name], [name] ([id number]) [id number] Page 1 of 10 Visit Report for 01/18/2023 Chief Complaint Document Details Patient Name: Date of Service: [name] [name], [place] 01/18/2023 2:15 PM Medical Record Number: [id number] Patient Account Number: [id number] Date of Birth/Sex: Treating RN: [birth year] (53 [name].o. NETTY [name] [name] Primary Care Provider: Lenon Na Other Clinician: Referring Provider: Treating Provider/Extender: Bethena Andre Lenon Na Devra in Treatment: 11 Information Obtained from: Patient Chief Complaint Left foot and Left LE Ulcers Electronic Signature(s) Signed: 01/18/2023 2:15:00 PM By: Bethena Andre PA-C Entered By: Bethena Andre on 01/18/2023 14:15:00 -------------------------------------------------------------------------------- Debridement Details Patient Name: Date of Service: [place] [name], [place] 01/18/2023 2:15 PM Medical Record Number: [id number] Patient Account Number: [id number] Date of Birth/Sex: Treating RN: March 08, 1970 (79 [name].o. NETTY [name] [name] Primary Care Provider: Lenon Na Other Clinician: Referring Provider: Treating Provider/Extender: Bethena Andre Lenon Na Devra in Treatment: 11 Debridement Performed for Assessment: Wound #3 Left,Lateral Lower Leg Performed By: Physician Bethena Andre, PA-C The following information was scribed by: [name] [name] The information was scribed for: Bethena Andre Debridement Type: Debridement Severity of Tissue Pre Debridement: Fat layer exposed Level of Consciousness (Pre-procedure): Awake and Alert Pre-procedure Verification/Time Out Yes - 14:49 Taken: Pain Control: Lidocaine 4% T opical Solution Percent of Wound Bed Debrided: 100% T Area Debrided (cm): otal 0.75 Tissue and other material debrided: Viable, Non-Viable, Slough, Subcutaneous, Slough Level: Skin/Subcutaneous Tissue Debridement Description: Excisional Instrument: Curette Bleeding:  Moderate Hemostasis Achieved: Pressure Hofmeister, Rocklin ([id number]) [phone].pdf Page 2 of 10 Response to Treatment: Procedure was tolerated well Level of Consciousness (Post- Awake and Alert procedure): Post Debridement Measurements of Total Wound Length: (cm) 1.9 Width: (cm) 0.5 Depth: (cm) 0.1 Volume: (cm) 0.075 Character of Wound/Ulcer Post Debridement: Stable Severity of Tissue Post Debridement: Fat layer exposed Post Procedure Diagnosis Same as Pre-procedure Electronic Signature(s) Signed: 01/18/2023 4:00:17 PM By: [name] [name] Signed: 01/18/2023 4:44:02 PM By: Bethena Andre PA-C Entered By: [name] [name] on 01/18/2023 14:49:36 -------------------------------------------------------------------------------- Debridement Details Patient Name: Date of Service: [place] L[place] [name] [name], [place] 01/18/2023 2:15 PM Medical Record Number: [id number] Patient Account Number: [id number] Date of Birth/Sex: Treating RN: 1970/09/01 (89 [name].o. NETTY [name] [name] Primary Care Provider: Lenon Na Other Clinician: Referring Provider: Treating Provider/Extender: Bethena Andre Lenon Na Devra in Treatment: 11 Debridement Performed for Assessment: Wound #1 Left,Plantar Foot Performed By: Physician Bethena Andre, PA-C The following information was scribed by: [name] [name] The information was scribed for: Bethena Andre Debridement Type: Debridement Severity of Tissue Pre Debridement: Fat layer exposed Level of Consciousness (Pre-procedure): Awake and Alert Pre-procedure Verification/Time Out Yes - 14:50 Taken: Pain Control: Lidocaine 4% T opical Solution Percent of Wound Bed Debrided: 100% T Area Debrided (cm): otal 1.6 Tissue and other material debrided: Viable, Non-Viable, Callus, Slough, Subcutaneous, Slough Level: Skin/Subcutaneous Tissue Debridement Description: Excisional Instrument: Curette Bleeding: Moderate Hemostasis Achieved: Silver  Nitrate Response to Treatment: Procedure was tolerated well Level of Consciousness (Post- Awake and Alert procedure): Post Debridement Measurements of Total Wound Length: (cm) 1.2 Width: (cm) 1.7 Depth: (cm) 0.4 Volume: (cm) 0.641 Character of Wound/Ulcer Post Debridement: Stable Severity of Tissue Post Debridement: Fat layer exposed Post Procedure Diagnosis Same as Pre-procedure Farney, [name] ([id number]) [id number] Page 3 of 10 Notes 1 stick silver nitrate used Electronic Signature(s) Signed: 01/18/2023 4:00:17 PM By: [name] [name] Signed: 01/18/2023 4:44:02 PM By: Bethena Andre PA-C Entered By: [name] [name] on 01/18/2023 14:55:16 -------------------------------------------------------------------------------- HPI Details Patient Name: Date of Service: [name], [place] 01/18/2023 2:15  PM Medical Record Number: [id number] Patient Account Number: [id number] Date of Birth/Sex: Treating RN: 1970-12-25 (53 [name].o. NETTY [name] [name] Primary Care Provider: Lenon Na Other Clinician: Referring Provider: Treating Provider/Extender: Bethena Andre Lenon Na Devra in Treatment: 11 History of Present Illness Chronic/Inactive Conditions Condition 1: 11-01-2022 on evaluation today patient's ABIs were unfortunately noncompressible bilaterally though he has good pulses 1+ equal bilaterally when I palpated and the reason I could not feel them better it appears as because of the fact that he is having a lot of issues with swelling and again due to his size in general. Nonetheless his capillary refill was actually less than a second and it filled really quickly after testing and I feel like that his extremities were nice and warm he had hair growth as well in the lower extremities I do not think he needs to go for formal evaluation at this point. HPI Description: 11-01-2022 upon evaluation today patient's wound bed actually showed signs of having  multiple wounds over the left lower extremity 2 in fact on the leg 1 on the plantar foot. He tells me he does not really know how long the foot is been present although he states his wife noted it 2 weeks ago. Obviously looking at this wound I think this is much longer than 2 weeks although he does not have feeling he is a diabetic type II with diabetic neuropathy and this subsequently means that he does not really know exactly when this occurred. He is not having any pain obviously at this point in the leg ulcers he tells me started out as blisters and again the foot he really does not know how that started. He does have a significant amount of callus buildup in the foot region. Patient does have a history as noted above diabetes mellitus type 2 with a hemoglobin A1c of 7.8 that was on October 25, 2022. He also has a history of hypertension and obesity but no other major medical problems that would particularly contribute to this. Well currently in regard to 11-09-2022 upon evaluation today patient appears to be doing his wounds as far as being stable for the most part although the left anterior leg actually showed signs of cellulitis this is not doing as well as I like to see it. Has not been using Tubigrip quite right. I think we need to try to get this infection under control before all else. He voiced understanding. With that being said I do think that at this point we are going to mark where the erythema redness is visually and tactile we are going to monitor and make sure that this does not spread if it does he needs to go to the ER soon as possible for now I am going to send in a prescription for Levaquin for him which I think is gena be the best way to go and he is in agreement with that plan. 01-17-2022 upon evaluation today patient appears to be doing pretty well currently in regard to his leg wound although the Hydrofera Blue is getting somewhat stuck here. Fortunately I do not see any  evidence of active infection at this time which is good news. In general I think that we are making headway here. With regard to the wound on his foot I do feel like we are making some progress there is a little bit of hypergranulation and some callus buildup this is not nearly as bad as what we previously noted. Electronic Signature(s) Signed: 01/18/2023 3:10:48  PM By: Bethena Ferraris PA-C Entered By: Bethena Ferraris on 01/18/2023 15:10:47 Placide, Lemon ([id number]) [id number] Page 4 of 10 -------------------------------------------------------------------------------- Physical Exam Details Patient Name: Date of Service: [name] [name], [place] 01/18/2023 2:15 PM Medical Record Number: [id number] Patient Account Number: [id number] Date of Birth/Sex: Treating RN: Mar 20, 1970 (4 [name].o. NETTY [name] [name] Primary Care Provider: Lenon Na Other Clinician: Referring Provider: Treating Provider/Extender: Bethena Ferraris Lenon Na Devra in Treatment: 11 Constitutional Well-nourished and well-hydrated in no acute distress. Respiratory normal breathing without difficulty. Psychiatric this patient is able to make decisions and demonstrates good insight into disease process. Alert and Oriented x 3. pleasant and cooperative. Notes Upon inspection patient's wound bed actually showed signs of need for sharp debridement at both locations. I did perform debridement clearway necrotic breed tolerated this today without complication postdebridement wound bed appears to be doing significantly better which is great news. Electronic Signature(s) Signed: 01/18/2023 3:11:20 PM By: Bethena Ferraris PA-C Entered By: Bethena Ferraris on 01/18/2023 15:11:20 -------------------------------------------------------------------------------- Physician Orders Details Patient Name: Date of Service: [place] L[place] [name], [place] 01/18/2023 2:15 PM Medical Record Number: [id number] Patient Account Number:  [id number] Date of Birth/Sex: Treating RN: 04-17-1970 (86 [name].o. NETTY [name] [name] Primary Care Provider: Lenon Na Other Clinician: Referring Provider: Treating Provider/Extender: Bethena Ferraris Lenon Na Devra in Treatment: 11 The following information was scribed by: [name] [name] The information was scribed for: Bethena Ferraris Verbal / Phone Orders: No Diagnosis Coding ICD-10 Coding Code Description E11.621 Type 2 diabetes mellitus with foot ulcer L97.522 Non-pressure chronic ulcer of other part of left foot with fat layer exposed L97.822 Non-pressure chronic ulcer of other part of left lower leg with fat layer exposed I87.332 Chronic venous hypertension (idiopathic) with ulcer and inflammation of left lower extremity E11.622 Type 2 diabetes mellitus with other skin ulcer I10 Essential (primary) hypertension Follow-up Appointments Return Appointment in 1 week. Bathing/ Shower/ Hygiene May shower with wound dressing protected with water repellent cover or cast protector. No tub bath. Anesthetic (Use 'Patient Medications' Section for Anesthetic Order Entry) Lidocaine applied to wound bed Seefeld, Gustavo ([id number]) [phone].pdf Page 5 of 10 Edema Control - Orders / Instructions UrgoK2 40mmhg - Large Elevate, Exercise Daily and A void Standing for Long Periods of Time. Elevate leg(s) parallel to the floor when sitting. DO YOUR BEST to sleep in the bed at night. DO NOT sleep in your recliner. Long hours of sitting in a recliner leads to swelling of the legs and/or potential wounds on your backside. Off-Loading Open toe surgical shoe with peg assist. - left foot Other: - dial soap original gold recommended Medications-Please add to medication list. ntibiotics - continue and finish as prescribed P.O. A Wound Treatment Wound #1 - Foot Wound Laterality: Plantar, Left Cleanser: Byram Ancillary Kit - 15 Day Supply (Generic) Every Other Day/30  Days Discharge Instructions: Use supplies as instructed; Kit contains: (15) Saline Bullets; (15) 3x3 Gauze; 15 pr Gloves Cleanser: Soap and Water Every Other Day/30 Days Discharge Instructions: Gently cleanse wound with antibacterial soap, rinse and pat dry prior to dressing wounds Cleanser: Wound Cleanser Every Other Day/30 Days Discharge Instructions: Wash your hands with soap and water. Remove old dressing, discard into plastic bag and place into trash. Cleanse the wound with Wound Cleanser prior to applying a clean dressing using gauze sponges, not tissues or cotton balls. Do not scrub or use excessive force. Pat dry using gauze sponges, not tissue or cotton balls. Prim Dressing: Hydrofera Blue Ready Transfer Foam,  2.5x2.5 (in/in) Every Other Day/30 Days ary Discharge Instructions: Apply Hydrofera Blue Ready to wound bed as directed Secondary Dressing: ABD Pad 5x9 (in/in) Every Other Day/30 Days Discharge Instructions: Cover with ABD pad Compression Wrap: Urgo K2, two layer compression system, large Every Other Day/30 Days Wound #3 - Lower Leg Wound Laterality: Left, Lateral Cleanser: Byram Ancillary Kit - 15 Day Supply (Generic) Every Other Day/30 Days Discharge Instructions: Use supplies as instructed; Kit contains: (15) Saline Bullets; (15) 3x3 Gauze; 15 pr Gloves Cleanser: Soap and Water Every Other Day/30 Days Discharge Instructions: Gently cleanse wound with antibacterial soap, rinse and pat dry prior to dressing wounds Cleanser: Wound Cleanser Every Other Day/30 Days Discharge Instructions: Wash your hands with soap and water. Remove old dressing, discard into plastic bag and place into trash. Cleanse the wound with Wound Cleanser prior to applying a clean dressing using gauze sponges, not tissues or cotton balls. Do not scrub or use excessive force. Pat dry using gauze sponges, not tissue or cotton balls. Prim Dressing: Promogran Matrix 4.34 (in) Every Other Day/30  Days ary Discharge Instructions: Moisten w/normal saline or sterile water; Cover wound as directed. Do not remove from wound bed. Prim Dressing: Xeroform-HBD 2x2 (in/in) Every Other Day/30 Days ary Discharge Instructions: Apply Xeroform-HBD 2x2 (in/in) as directed Secondary Dressing: ABD Pad 5x9 (in/in) Every Other Day/30 Days Discharge Instructions: Cover with ABD pad Compression Wrap: Urgo K2, two layer compression system, large Every Other Day/30 Days Electronic Signature(s) Signed: 01/18/2023 4:00:17 PM By: [name] [name] Signed: 01/18/2023 4:44:02 PM By: Bethena Ferraris PA-C Entered By: [name] [name] on 01/18/2023 14:52:07 Carfagno, Rashun ([id number]) [id number] Page 6 of 10 -------------------------------------------------------------------------------- Problem List Details Patient Name: Date of Service: [name] [name], [place] 01/18/2023 2:15 PM Medical Record Number: [id number] Patient Account Number: [id number] Date of Birth/Sex: Treating RN: 1970-06-22 (79 [name].o. NETTY [name] [name] Primary Care Provider: Lenon Na Other Clinician: Referring Provider: Treating Provider/Extender: Bethena Ferraris Lenon Na Devra in Treatment: 11 Active Problems ICD-10 Encounter Code Description Active Date MDM Diagnosis E11.621 Type 2 diabetes mellitus with foot ulcer 11/01/2022 No Yes L97.522 Non-pressure chronic ulcer of other part of left foot with fat layer exposed 11/01/2022 No Yes L97.822 Non-pressure chronic ulcer of other part of left lower leg with fat layer 11/01/2022 No Yes exposed I87.332 Chronic venous hypertension (idiopathic) with ulcer and inflammation of left 11/01/2022 No Yes lower extremity E11.622 Type 2 diabetes mellitus with other skin ulcer 11/01/2022 No Yes I10 Essential (primary) hypertension 11/01/2022 No Yes Inactive Problems Resolved Problems Electronic Signature(s) Signed: 01/18/2023 2:14:55 PM By: Bethena Ferraris PA-C Entered  By: Bethena Ferraris on 01/18/2023 14:14:55 Duecker, Diyari ([id number]) [id number] Page 7 of 10 -------------------------------------------------------------------------------- Progress Note Details Patient Name: Date of Service: [name] [name], [place] 01/18/2023 2:15 PM Medical Record Number: [id number] Patient Account Number: [id number] Date of Birth/Sex: Treating RN: January 07, 1970 (13 [name].o. NETTY [name] [name] Primary Care Provider: Lenon Na Other Clinician: Referring Provider: Treating Provider/Extender: Bethena Ferraris Lenon Na Devra in Treatment: 11 Subjective Chief Complaint Information obtained from Patient Left foot and Left LE Ulcers History of Present Illness (HPI) Chronic/Inactive Condition: 11-01-2022 on evaluation today patient's ABIs were unfortunately noncompressible bilaterally though he has good pulses 1+ equal bilaterally when I palpated and the reason I could not feel them better it appears as because of the fact that he is having a lot of issues with swelling and again due to his size in general. Nonetheless his capillary refill was actually less  than a second and it filled really quickly after testing and I feel like that his extremities were nice and warm he had hair growth as well in the lower extremities I do not think he needs to go for formal evaluation at this point. 11-01-2022 upon evaluation today patient's wound bed actually showed signs of having multiple wounds over the left lower extremity 2 in fact on the leg 1 on the plantar foot. He tells me he does not really know how long the foot is been present although he states his wife noted it 2 weeks ago. Obviously looking at this wound I think this is much longer than 2 weeks although he does not have feeling he is a diabetic type II with diabetic neuropathy and this subsequently means that he does not really know exactly when this occurred. He is not having any pain obviously  at this point in the leg ulcers he tells me started out as blisters and again the foot he really does not know how that started. He does have a significant amount of callus buildup in the foot region. Patient does have a history as noted above diabetes mellitus type 2 with a hemoglobin A1c of 7.8 that was on October 25, 2022. He also has a history of hypertension and obesity but no other major medical problems that would particularly contribute to this. Well currently in regard to 11-09-2022 upon evaluation today patient appears to be doing his wounds as far as being stable for the most part although the left anterior leg actually showed signs of cellulitis this is not doing as well as I like to see it. Has not been using Tubigrip quite right. I think we need to try to get this infection under control before all else. He voiced understanding. With that being said I do think that at this point we are going to mark where the erythema redness is visually and tactile we are going to monitor and make sure that this does not spread if it does he needs to go to the ER soon as possible for now I am going to send in a prescription for Levaquin for him which I think is gena be the best way to go and he is in agreement with that plan. 01-17-2022 upon evaluation today patient appears to be doing pretty well currently in regard to his leg wound although the Hydrofera Blue is getting somewhat stuck here. Fortunately I do not see any evidence of active infection at this time which is good news. In general I think that we are making headway here. With regard to the wound on his foot I do feel like we are making some progress there is a little bit of hypergranulation and some callus buildup this is not nearly as bad as what we previously noted. Objective Constitutional Well-nourished and well-hydrated in no acute distress. Vitals Time Taken: 2:17 PM, Height: 74 in, Weight: 546 lbs, BMI: 70.1, Temperature: 97.5 F,  Pulse: 86 bpm, Respiratory Rate: 18 breaths/min, Blood Pressure: 204/119 mmHg. General Notes: pt states feels fine/asymptomatic Respiratory normal breathing without difficulty. Psychiatric this patient is able to make decisions and demonstrates good insight into disease process. Alert and Oriented x 3. pleasant and cooperative. General Notes: Upon inspection patient's wound bed actually showed signs of need for sharp debridement at both locations. I did perform debridement clearway necrotic breed tolerated this today without complication postdebridement wound bed appears to be doing significantly better which is great news. Integumentary (Hair, Skin)  Wound #1 status is Open. Original cause of wound was Gradually Appeared. The date acquired was: 08/19/2022. The wound has been in treatment 11 weeks. The wound is located on the Left,Plantar Foot. The wound measures 1.2cm length x 1.7cm width x 0.4cm depth; 1.602cm^2 area and 0.641cm^3 volume. There is Fat Layer (Subcutaneous Tissue) exposed. There is no tunneling or undermining noted. There is a medium amount of serosanguineous drainage noted. There is large (67-100%) red granulation within the wound bed. There is no necrotic tissue within the wound bed. Wound #3 status is Open. Original cause of wound was Gradually Appeared. The date acquired was: 08/19/2022. The wound has been in treatment 11 weeks. The wound is located on the Left,Lateral Lower Leg. The wound measures 1.9cm length x 0.5cm width x 0.1cm depth; 0.746cm^2 area and 0.075cm^3 volume. There is Fat Layer (Subcutaneous Tissue) exposed. There is no tunneling or undermining noted. There is a medium amount of serosanguineous drainage noted. There is large (67-100%) red granulation within the wound bed. There is no necrotic tissue within the wound bed. 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 There was a Excisional Skin/Subcutaneous Tissue Debridement with a total area of 1.6 sq cm performed by Bethena Ferraris, PA-C. With the following instrument(s): Curette to remove Viable and Non-Viable tissue/material. Material removed includes Callus, Subcutaneous Tissue, and Slough after achieving pain control using Lidocaine 4% Topical Solution. No specimens were taken. A time out was conducted at 14:50, prior to the start of the procedure. A Moderate amount of bleeding was controlled with Silver Nitrate. The procedure was tolerated well. Post Debridement Measurements: 1.2cm length x 1.7cm width x 0.4cm depth; 0.641cm^3 volume. Character of Wound/Ulcer Post Debridement is stable. Severity of Tissue Post Debridement is: Fat layer exposed. Post procedure Diagnosis Wound #1: Same as Pre-Procedure General Notes: 1 stick silver nitrate used. Wound #3 Pre-procedure diagnosis of Wound #3 is a Diabetic Wound/Ulcer of the Lower Extremity located on the Left,Lateral Lower Leg .Severity of Tissue Pre Debridement is: Fat layer exposed. There was a Excisional Skin/Subcutaneous Tissue Debridement with a total area of 0.75 sq cm performed by Bethena Ferraris, PA-C. With the following instrument(s): Curette to remove Viable and Non-Viable tissue/material. Material removed includes Subcutaneous Tissue and Slough and after achieving pain control using Lidocaine 4% Topical Solution. No  specimens were taken. A time out was conducted at 14:49, prior to the start of the procedure. A Moderate amount of bleeding was controlled with Pressure. The procedure was tolerated well. Post Debridement Measurements: 1.9cm length x 0.5cm width x 0.1cm depth; 0.075cm^3 volume. Character of Wound/Ulcer Post Debridement is stable. Severity of Tissue Post Debridement is: Fat layer exposed. Post procedure Diagnosis Wound #3: Same as Pre-Procedure Plan Follow-up Appointments: Return Appointment in 1 week. Bathing/ Shower/ Hygiene: May shower with wound dressing protected with water repellent cover or cast protector. No tub bath. Anesthetic (Use 'Patient Medications' Section for Anesthetic Order Entry): Lidocaine applied to wound bed Edema Control -  Orders / Instructions: UrgoK2 40mmhg - Large Elevate, Exercise Daily and Avoid Standing for Long Periods of Time. Elevate leg(s) parallel to the floor when sitting. DO YOUR BEST to sleep in the bed at night. DO NOT sleep in your recliner. Long hours of sitting in a recliner leads to swelling of the legs and/or potential wounds on your backside. Off-Loading: Open toe surgical shoe with peg assist. - left foot Other: - dial soap original gold recommended Medications-Please add to medication list.: P.O. Antibiotics - continue and finish as prescribed WOUND #1: - Foot Wound Laterality: Plantar, Left Cleanser: Byram Ancillary Kit - 15 Day Supply (Generic) Every Other Day/30 Days Discharge Instructions: Use supplies as instructed; Kit contains: (15) Saline Bullets; (15) 3x3 Gauze; 15 pr Gloves Cleanser: Soap and Water Every Other Day/30 Days Discharge Instructions: Gently cleanse wound with antibacterial soap, rinse and pat dry prior to dressing wounds Cleanser: Wound Cleanser Every Other Day/30 Days Discharge Instructions: Wash your hands with soap and water. Remove old dressing, discard into plastic bag and place into trash. Cleanse the wound with  Wound Cleanser prior to applying a clean dressing using gauze sponges, not tissues or cotton balls. Do not scrub or use excessive force. Pat dry using gauze sponges, not tissue or cotton balls. Prim Dressing: Hydrofera Blue Ready Transfer Foam, 2.5x2.5 (in/in) Every Other Day/30 Days ary Discharge Instructions: Apply Hydrofera Blue Ready to wound bed as directed Secondary Dressing: ABD Pad 5x9 (in/in) Every Other Day/30 Days Discharge Instructions: Cover with ABD pad Com pression Wrap: Urgo K2, two layer compression system, large Every Other Day/30 Days WOUND #3: - Lower Leg Wound Laterality: Left, Lateral Cleanser: Byram Ancillary Kit - 15 Day Supply (Generic) Every Other Day/30 Days Discharge Instructions: Use supplies as instructed; Kit contains: (15) Saline Bullets; (15) 3x3 Gauze; 15 pr Gloves Cleanser: Soap and Water Every Other Day/30 Days Discharge Instructions: Gently cleanse wound with antibacterial soap, rinse and pat dry prior to dressing wounds Cleanser: Wound Cleanser Every Other Day/30 Days Discharge Instructions: Wash your hands with soap and water. Remove old dressing, discard into plastic bag and place into trash. Cleanse the wound with Wound Cleanser prior to applying a clean dressing using gauze sponges, not tissues or cotton balls. Do not scrub or use excessive force. Pat dry using gauze sponges, not tissue or cotton balls. Prim Dressing: Promogran Matrix 4.34 (in) Every Other Day/30 Days ary Discharge Instructions: Moisten w/normal saline or sterile water; Cover wound as directed. Do not remove from wound bed. Prim Dressing: Xeroform-HBD 2x2 (in/in) Every Other Day/30 Days ary Mcclain, Derral ([id number]) [id number] Page 9 of 10 Discharge Instructions: Apply Xeroform-HBD 2x2 (in/in) as directed Secondary Dressing: ABD Pad 5x9 (in/in) Every Other Day/30 Days Discharge Instructions: Cover with ABD pad Compression Wrap: Urgo K2, two layer  compression system, large Every Other Day/30 Days 1. Based on what I am seeing I am going to recommend we switch out the wound on the leg to the collagen Promogran particularly I think this will do much better for him. 2. I would recommend as well the patient should use a Hydrofera Blue dressing for the foot will use some tape on either side just to help secure it in place and keep it from slipping. 3. I am also can recommend that he continue with the Urgo K2 compression wrap which does seem to be beneficial. We will see patient back for reevaluation in 1 week here in the clinic. If anything worsens or changes patient will contact our  office for additional recommendations. Electronic Signature(s) Signed: 01/18/2023 3:12:23 PM By: Bethena Ferraris PA-C Entered By: Bethena Ferraris on 01/18/2023 15:12:23 -------------------------------------------------------------------------------- SuperBill Details Patient Name: Date of Service: [place] LLO [name], [place] LV [place] 01/18/2023 Medical Record Number: [id number] Patient Account Number: [id number] Date of Birth/Sex: Treating RN: 10-10-1970 (58 [name].o. 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 I87.332 Chronic venous hypertension (idiopathic) with ulcer and inflammation of left lower extremity E11.622 Type 2 diabetes mellitus with other skin ulcer I10 Essential (primary) hypertension Facility Procedures : CPT4 Code: 63899987 Description: 11042 - DEB SUBQ TISSUE 20 SQ CM/< ICD-10 Diagnosis Description L97.522 Non-pressure chronic ulcer of other part of left foot with  fat layer exposed L97.822 Non-pressure chronic ulcer of other part of left lower leg with fat layer expos Modifier: ed Quantity: 1 Physician Procedures : CPT4 Code Description Modifier 11042 11042 - WC PHYS SUBQ TISS 20 SQ CM ICD-10 Diagnosis Description L97.522 Non-pressure chronic ulcer of other part of left foot with fat layer exposed L97.822 Non-pressure chronic ulcer of other part of left lower leg  with fat layer exposed Quantity: 1 Electronic Signature(s) Signed: 01/18/2023 3:12:32 PM By: Bethena Ferraris DEVONNA MAY, Antonie ([id number]) PM By: Bethena Ferraris PA-C [phone].pdf Page 10 of 10 Signed: 01/18/2023 3:12:32 Entered By: Bethena Ferraris on 01/18/2023 15:12:32

## 2023-01-28 ENCOUNTER — Encounter: Payer: 59 | Admitting: Physician Assistant

## 2023-01-28 DIAGNOSIS — I87332 Chronic venous hypertension (idiopathic) with ulcer and inflammation of left lower extremity: Secondary | ICD-10-CM | POA: Diagnosis not present

## 2023-02-07 ENCOUNTER — Encounter: Payer: 59 | Attending: Physician Assistant | Admitting: Physician Assistant

## 2023-02-07 DIAGNOSIS — E669 Obesity, unspecified: Secondary | ICD-10-CM | POA: Diagnosis not present

## 2023-02-07 DIAGNOSIS — E11622 Type 2 diabetes mellitus with other skin ulcer: Secondary | ICD-10-CM | POA: Insufficient documentation

## 2023-02-07 DIAGNOSIS — L97522 Non-pressure chronic ulcer of other part of left foot with fat layer exposed: Secondary | ICD-10-CM | POA: Insufficient documentation

## 2023-02-07 DIAGNOSIS — L97822 Non-pressure chronic ulcer of other part of left lower leg with fat layer exposed: Secondary | ICD-10-CM | POA: Insufficient documentation

## 2023-02-07 DIAGNOSIS — I87332 Chronic venous hypertension (idiopathic) with ulcer and inflammation of left lower extremity: Secondary | ICD-10-CM | POA: Diagnosis not present

## 2023-02-07 DIAGNOSIS — I10 Essential (primary) hypertension: Secondary | ICD-10-CM | POA: Insufficient documentation

## 2023-02-07 DIAGNOSIS — E11621 Type 2 diabetes mellitus with foot ulcer: Secondary | ICD-10-CM | POA: Diagnosis present

## 2023-02-14 ENCOUNTER — Encounter: Payer: 59 | Admitting: Physician Assistant

## 2023-02-14 DIAGNOSIS — E11621 Type 2 diabetes mellitus with foot ulcer: Secondary | ICD-10-CM | POA: Diagnosis not present

## 2023-02-22 ENCOUNTER — Encounter: Payer: 59 | Admitting: Physician Assistant

## 2023-02-22 DIAGNOSIS — E11621 Type 2 diabetes mellitus with foot ulcer: Secondary | ICD-10-CM | POA: Diagnosis not present

## 2023-03-03 ENCOUNTER — Encounter: Payer: 59 | Admitting: Physician Assistant

## 2023-03-03 DIAGNOSIS — E11621 Type 2 diabetes mellitus with foot ulcer: Secondary | ICD-10-CM | POA: Diagnosis not present

## 2023-03-11 ENCOUNTER — Encounter: Payer: 59 | Attending: Physician Assistant | Admitting: Physician Assistant

## 2023-03-11 DIAGNOSIS — I87332 Chronic venous hypertension (idiopathic) with ulcer and inflammation of left lower extremity: Secondary | ICD-10-CM | POA: Diagnosis not present

## 2023-03-11 DIAGNOSIS — I890 Lymphedema, not elsewhere classified: Secondary | ICD-10-CM | POA: Diagnosis not present

## 2023-03-11 DIAGNOSIS — I10 Essential (primary) hypertension: Secondary | ICD-10-CM | POA: Insufficient documentation

## 2023-03-11 DIAGNOSIS — E11622 Type 2 diabetes mellitus with other skin ulcer: Secondary | ICD-10-CM | POA: Insufficient documentation

## 2023-03-11 DIAGNOSIS — E11621 Type 2 diabetes mellitus with foot ulcer: Secondary | ICD-10-CM | POA: Insufficient documentation

## 2023-03-11 DIAGNOSIS — L97522 Non-pressure chronic ulcer of other part of left foot with fat layer exposed: Secondary | ICD-10-CM | POA: Diagnosis not present

## 2023-03-11 DIAGNOSIS — L97822 Non-pressure chronic ulcer of other part of left lower leg with fat layer exposed: Secondary | ICD-10-CM | POA: Diagnosis not present

## 2023-03-21 ENCOUNTER — Encounter: Attending: Physician Assistant | Admitting: Physician Assistant

## 2023-03-21 DIAGNOSIS — E11621 Type 2 diabetes mellitus with foot ulcer: Secondary | ICD-10-CM | POA: Insufficient documentation

## 2023-03-21 DIAGNOSIS — E11622 Type 2 diabetes mellitus with other skin ulcer: Secondary | ICD-10-CM | POA: Diagnosis not present

## 2023-03-21 DIAGNOSIS — I87332 Chronic venous hypertension (idiopathic) with ulcer and inflammation of left lower extremity: Secondary | ICD-10-CM | POA: Diagnosis not present

## 2023-03-21 DIAGNOSIS — L97522 Non-pressure chronic ulcer of other part of left foot with fat layer exposed: Secondary | ICD-10-CM | POA: Diagnosis not present

## 2023-03-21 DIAGNOSIS — L97822 Non-pressure chronic ulcer of other part of left lower leg with fat layer exposed: Secondary | ICD-10-CM | POA: Diagnosis not present

## 2023-03-21 DIAGNOSIS — I10 Essential (primary) hypertension: Secondary | ICD-10-CM | POA: Diagnosis not present

## 2023-03-21 DIAGNOSIS — I890 Lymphedema, not elsewhere classified: Secondary | ICD-10-CM | POA: Insufficient documentation

## 2023-03-22 ENCOUNTER — Ambulatory Visit: Admitting: Podiatry

## 2023-03-22 ENCOUNTER — Encounter: Payer: Self-pay | Admitting: Podiatry

## 2023-03-22 DIAGNOSIS — B351 Tinea unguium: Secondary | ICD-10-CM

## 2023-03-22 DIAGNOSIS — M79675 Pain in left toe(s): Secondary | ICD-10-CM | POA: Diagnosis not present

## 2023-03-22 DIAGNOSIS — M79674 Pain in right toe(s): Secondary | ICD-10-CM

## 2023-03-22 NOTE — Progress Notes (Signed)
   Chief Complaint  Patient presents with   Diabetes    "I'm just getting Diabetic foot treatment again."    SUBJECTIVE Patient with a history of diabetes mellitus presents to office today complaining of elongated, thickened nails that cause pain while ambulating in shoes.  Patient is unable to trim their own nails. Patient is here for further evaluation and treatment.  Past Medical History:  Diagnosis Date   DM (diabetes mellitus) (HCC)    Gout    HTN (hypertension)    Obesity     No Known Allergies   OBJECTIVE General Patient is awake, alert, and oriented x 3 and in no acute distress. Derm Skin is dry and supple bilateral. Negative open lesions or macerations. Remaining integument unremarkable. Nails are tender, long, thickened and dystrophic with subungual debris, consistent with onychomycosis, 1-5 bilateral. No signs of infection noted. Vasc  DP and PT pedal pulses palpable bilaterally. Temperature gradient within normal limits.  Neuro Light touch and protective threshold sensation diminished bilaterally.  Musculoskeletal Exam No symptomatic pedal deformities noted bilateral. Muscular strength within normal limits.  ASSESSMENT 1. Diabetes Mellitus w/ peripheral neuropathy 2.  Pain due to onychomycosis of toenails bilateral  PLAN OF CARE 1. Patient evaluated today. 2. Instructed to maintain good pedal hygiene and foot care. Stressed importance of controlling blood sugar.  3. Mechanical debridement of nails 1-5 bilaterally performed using a nail nipper. Filed with dremel without incident.  4. Return to clinic in 3 mos.     Felecia Shelling, DPM [facility]  Dr. Felecia Shelling, DPM    [address] [place], [place] [address]                Office [phone]  Fax [phone]

## 2023-03-28 ENCOUNTER — Ambulatory Visit: Admitting: Physician Assistant

## 2023-04-05 ENCOUNTER — Ambulatory Visit: Admitting: Internal Medicine

## 2023-04-08 ENCOUNTER — Encounter: Attending: Physician Assistant | Admitting: Physician Assistant

## 2023-04-08 DIAGNOSIS — Z872 Personal history of diseases of the skin and subcutaneous tissue: Secondary | ICD-10-CM | POA: Diagnosis not present

## 2023-04-08 DIAGNOSIS — Z09 Encounter for follow-up examination after completed treatment for conditions other than malignant neoplasm: Secondary | ICD-10-CM | POA: Insufficient documentation

## 2023-04-08 DIAGNOSIS — I87302 Chronic venous hypertension (idiopathic) without complications of left lower extremity: Secondary | ICD-10-CM | POA: Insufficient documentation

## 2023-04-08 DIAGNOSIS — I10 Essential (primary) hypertension: Secondary | ICD-10-CM | POA: Diagnosis not present

## 2023-04-08 DIAGNOSIS — I890 Lymphedema, not elsewhere classified: Secondary | ICD-10-CM | POA: Diagnosis not present

## 2023-04-08 DIAGNOSIS — Z8631 Personal history of diabetic foot ulcer: Secondary | ICD-10-CM | POA: Diagnosis not present

## 2023-07-01 ENCOUNTER — Ambulatory Visit: Admitting: Podiatry

## 2023-08-05 ENCOUNTER — Encounter: Payer: Self-pay | Admitting: Podiatry

## 2023-08-05 ENCOUNTER — Ambulatory Visit: Admitting: Podiatry

## 2023-08-05 VITALS — Ht 74.0 in | Wt >= 6400 oz

## 2023-08-05 DIAGNOSIS — B351 Tinea unguium: Secondary | ICD-10-CM | POA: Diagnosis not present

## 2023-08-05 DIAGNOSIS — M79675 Pain in left toe(s): Secondary | ICD-10-CM | POA: Diagnosis not present

## 2023-08-05 DIAGNOSIS — M79674 Pain in right toe(s): Secondary | ICD-10-CM | POA: Diagnosis not present

## 2023-08-05 NOTE — Progress Notes (Signed)
   Chief Complaint  Patient presents with   Nail Problem    Pt is here for [facility].    SUBJECTIVE Patient with a history of diabetes mellitus presents to office today complaining of elongated, thickened nails that cause pain while ambulating in shoes.  Patient is unable to trim their own nails. Patient is here for further evaluation and treatment.  Past Medical History:  Diagnosis Date   DM (diabetes mellitus) (HCC)    Gout    HTN (hypertension)    Obesity     No Known Allergies   OBJECTIVE General Patient is awake, alert, and oriented x 3 and in no acute distress. Derm Skin is dry and supple bilateral. Negative open lesions or macerations. Remaining integument unremarkable. Nails are tender, long, thickened and dystrophic with subungual debris, consistent with onychomycosis, 1-5 bilateral. No signs of infection noted. Vasc  DP and PT pedal pulses palpable bilaterally. Temperature gradient within normal limits.  Neuro Light touch and protective threshold sensation diminished bilaterally.  Musculoskeletal Exam No symptomatic pedal deformities noted bilateral. Muscular strength within normal limits.  ASSESSMENT 1. Diabetes Mellitus w/ peripheral neuropathy 2.  Pain due to onychomycosis of toenails bilateral  PLAN OF CARE 1. Patient evaluated today. 2. Instructed to maintain good pedal hygiene and foot care. Stressed importance of controlling blood sugar.  3. Mechanical debridement of nails 1-5 bilaterally performed using a nail nipper. Filed with dremel without incident.  4. Return to clinic in 3 mos.     Thresa EMERSON Sar, DPM [facility]  Dr. Thresa EMERSON Sar, DPM    [address] [place], [place] [address]                Office [phone]  Fax [phone]

## 2023-11-08 ENCOUNTER — Ambulatory Visit: Admitting: Podiatry
